# Patient Record
Sex: Female | Born: 1935
Health system: Midwestern US, Academic
[De-identification: ages and names within clinical notes are randomized; demographics above are authoritative.]

---

## 2019-05-17 ENCOUNTER — Encounter: Admit: 2019-05-17 | Discharge: 2019-05-17

## 2019-05-17 NOTE — Progress Notes
Pt presented with non specific complaints,  Headache, dizziness, confusion, gait instability.  CT head unremarkable.  CTA unremarkable.  MRI---> scattered diffusion, characterized by infarct.    Not a TPA candidate.  Dr. Consuella Lose recommends admit with basic workup.  ED provider is agreeable to POC.

## 2020-02-19 IMAGING — CT CT head/brain wo con
3 of 4 series · 14 of 47 positions shown, 16 images · non-contrast
Comparison: none

[Series 4: brain cor 5.00 hr40 s3 · coronal · 0.30mm/px · 3 of 39 slices shown]
[im 13/39  brain]
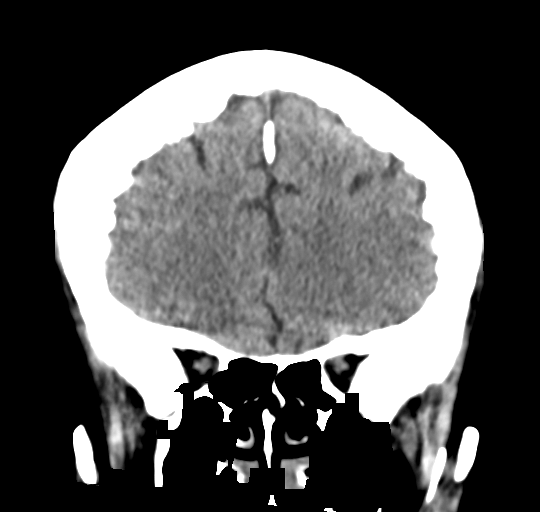
[im 17/39  brain]
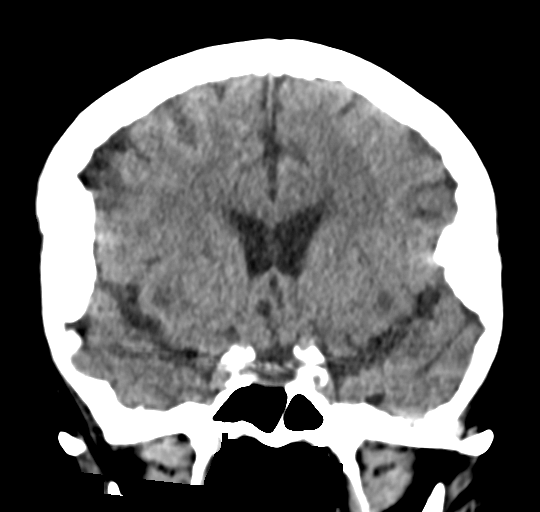
[im 22/39  brain]
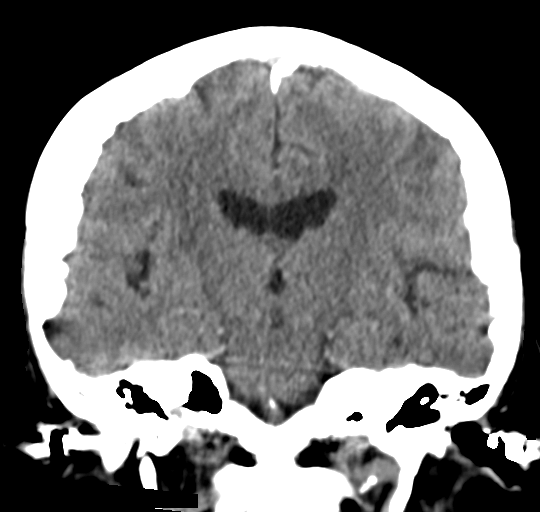

[Series 6: brain sag 5.00 hr40 s3 · sagittal · 0.30mm/px · 3 of 32 slices shown]
[im 11/32  brain]
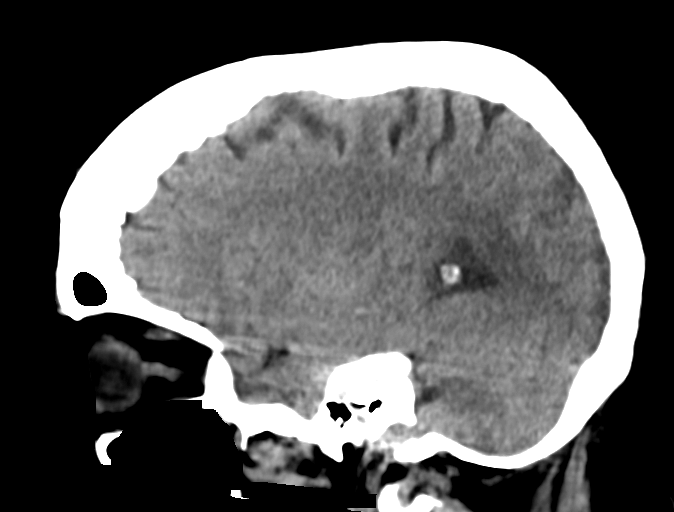
[im 16/32  brain]
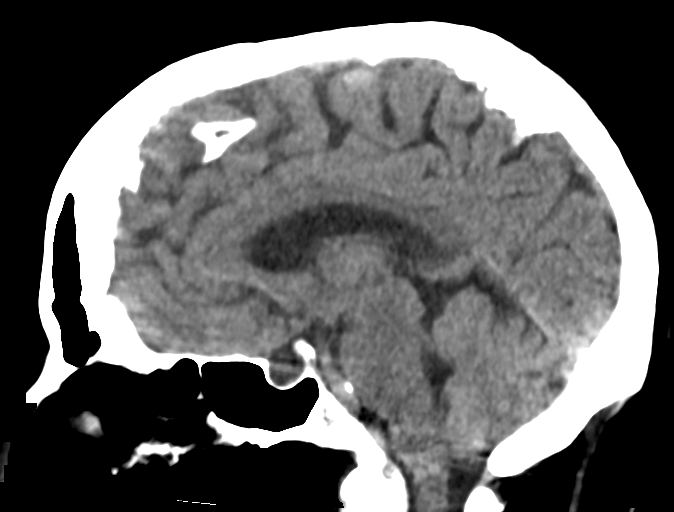
[im 21/32  brain]
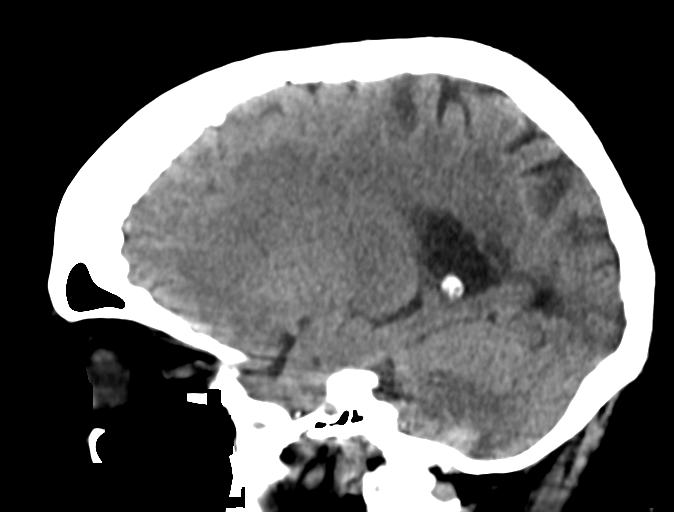

[Series 8: brain ax 2.00 hr60 s3 · axial · 0.33mm/px · z∈[-541,-421]mm · 8 of 77 slices shown, 10 images]
[im 8/77  brain]
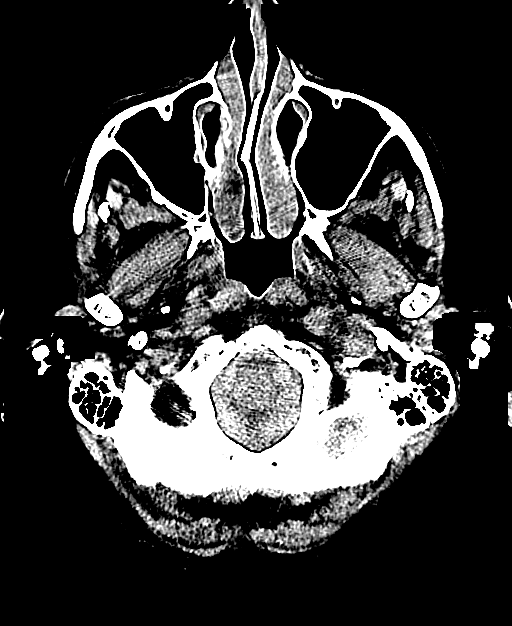
[im 8/77  bone]
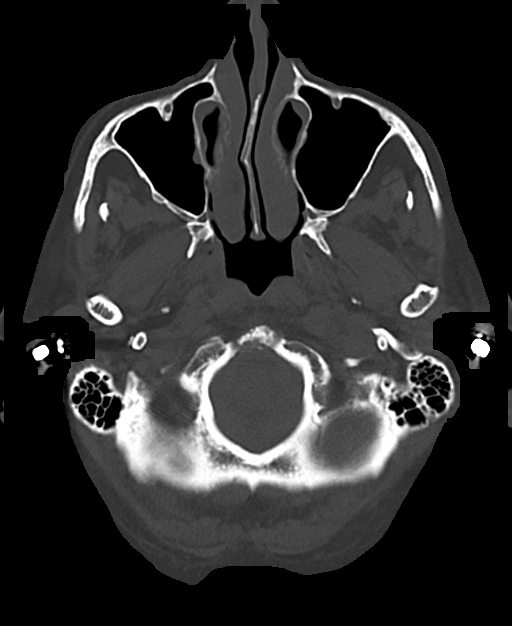
[im 15/77  brain]
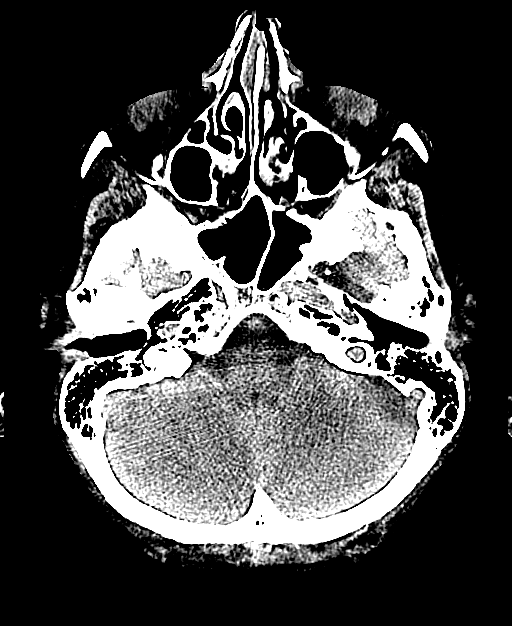
[im 26/77  brain]
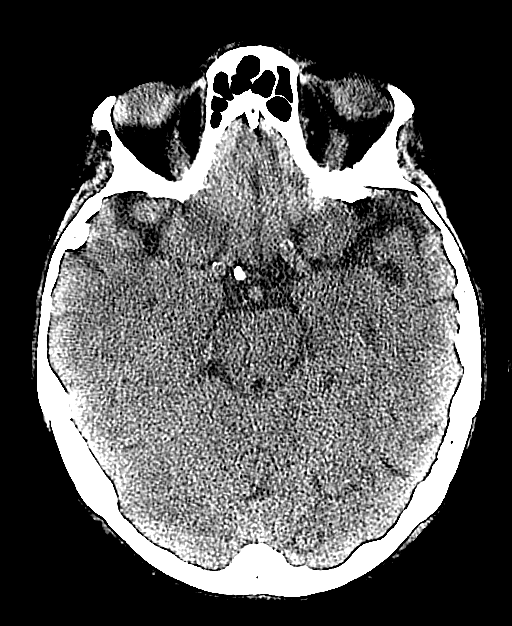
[im 33/77  brain]
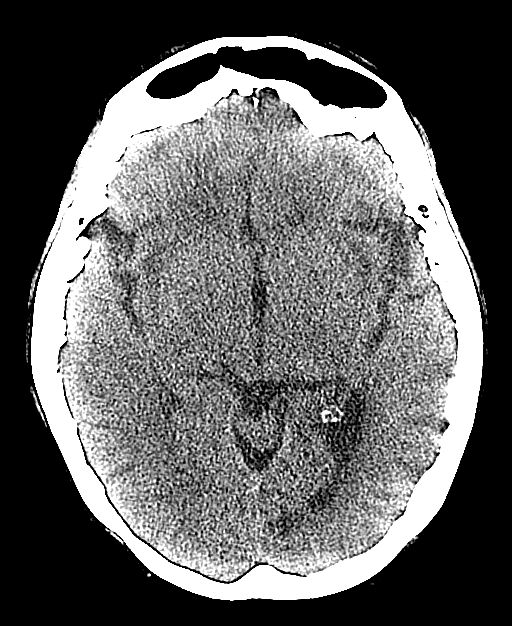
[im 44/77  brain]
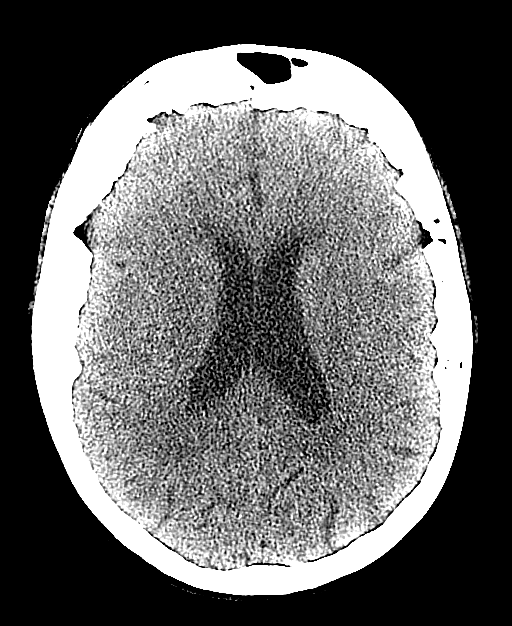
[im 44/77  bone]
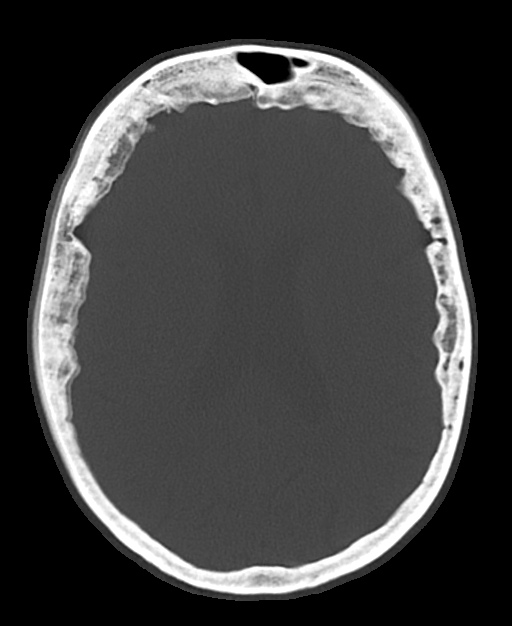
[im 51/77  brain]
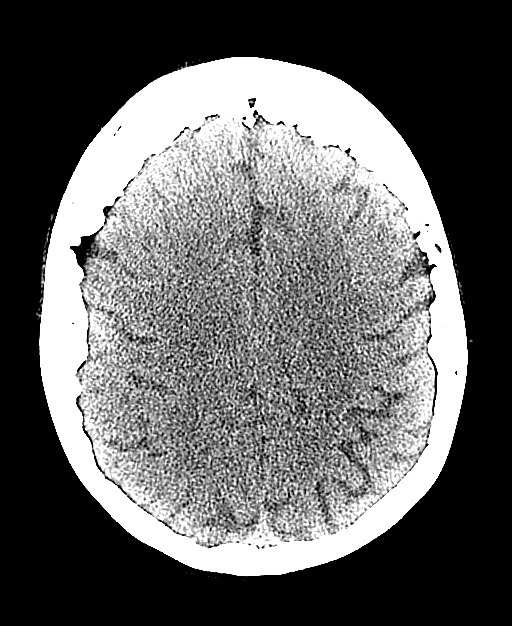
[im 62/77  brain]
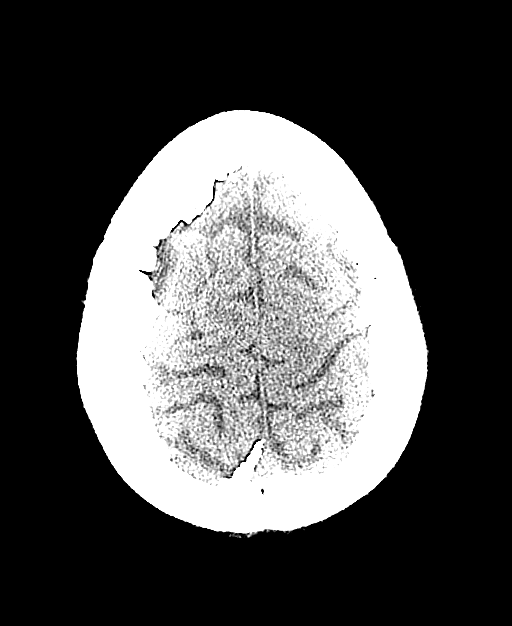
[im 69/77  brain]
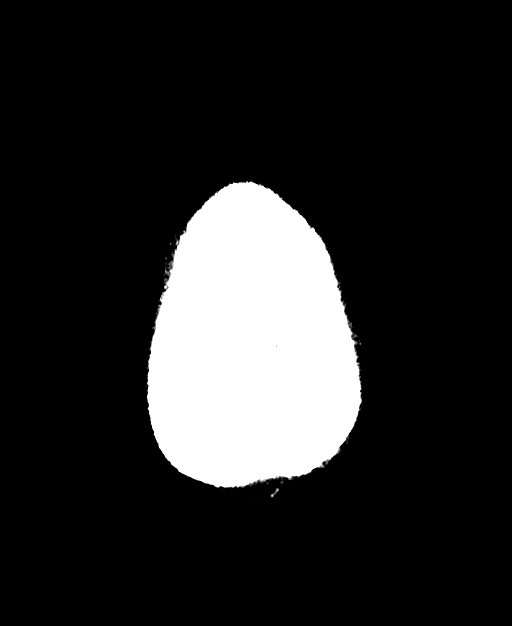

[14 of 47 positions shown; findings below may reference images not displayed]

DIAGNOSTIC STUDIES

EXAM

CT head without contrast.

INDICATION

AMS
0 ct/0 nm. Right sided weakness and loss of sight. H/O stroke in May 2019. me/rg

TECHNIQUE

All CT scans at this facility use dose modulation, iterative reconstruction, and/or weight based
dosing when appropriate to reduce radiation dose to as low as reasonably achievable.

Number of previous computed tomography exams in the last 12 months is 0  .

Number of previous nuclear medicine myocardial perfusion studies in the last 12 months is 0  .

COMPARISONS

None available

FINDINGS

Cortical atrophy and proportional enlargement of the ventricular system is seen. There is small
vessel ischemic disease throughout the supratentorial white matter. Subtle loss of gray-white dif
ferentiation and cortical sulci effacement is evident within the right occipital lobe concerning for
acute ischemia.

Small sub insular to chronic appearing infarcts are noted bilaterally.

No concerning osseous lesions are seen. There is calcification of the transverse ligament which may
indicate CPPD. There is calcification of the cavernous carotid arteries.

IMPRESSION

Acute to subacute appearing infarct involving the right occipital lobe.

Cortical atrophy and chronic small vessel ischemic disease including chronic appearing sub insular
infarcts bilaterally.

No intracranial hemorrhage or mass effect.

Bilateral hearing aids are in place with calcification of the cavernous carotid arteries. Report
called to Dr. Rtoyota  at [DATE] p.m..

Tech Notes:

0 ct/0 nm. Right sided weakness and loss of sight. H/O stroke in May 2019. me/rg

## 2020-03-15 IMAGING — CR [ID]
1 series · 1 of 1 positions shown · non-contrast
Comparison: none

[chest ap]
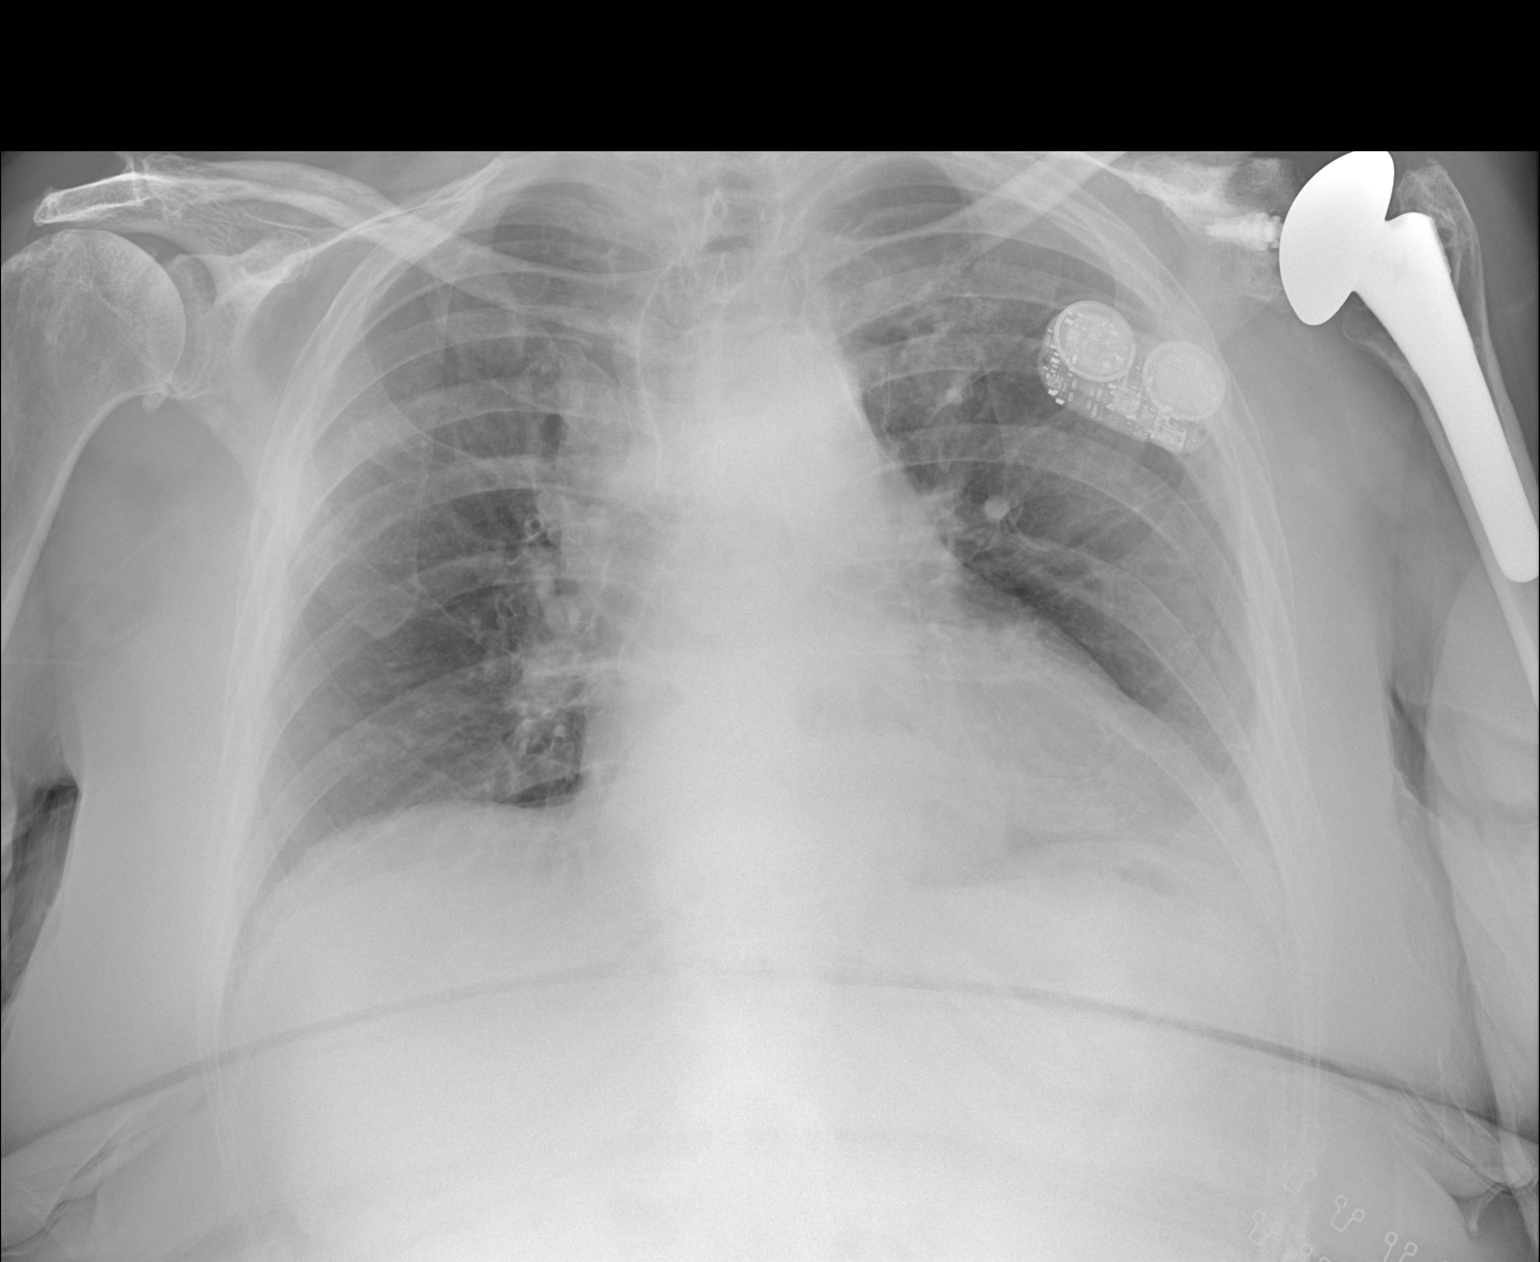

[1 of 1 positions shown; findings below may reference images not displayed]

DIAGNOSTIC STUDIES

EXAM

XR chest 1V

INDICATION

weakness
HEADACHE, WHICH SHE STATES IS GETTING WORSE. H/O PRIOR CVA X 1 MONTH AND 15 MONTHS AGO. H/O HTN.
HB

TECHNIQUE

AP chest.

COMPARISONS

February 19, 2020

FINDINGS

Cardiac silhouette is unchanged with mild cardiomegaly. Electrode overlies left chest with prior
left shoulder arthroplasty. No acute infiltrates are seen.

IMPRESSION

No acute infiltrates throughout the chest.

Tech Notes:

HEADACHE, WHICH SHE STATES IS GETTING WORSE. H/O PRIOR CVA X 1 MONTH AND 15 MONTHS AGO. H/O HTN.
HB

## 2020-03-15 IMAGING — CT BRAIN WO(Adult)
1 of 2 series · 1 of 2 positions shown, 2 images · non-contrast
Comparison: none

[Series 2: brain ax 5.00 hr40 s3 · axial · 0.31mm/px · z∈[-429,-429]mm · 1 of 1 slices shown, 2 images]
[im 1/1  brain]
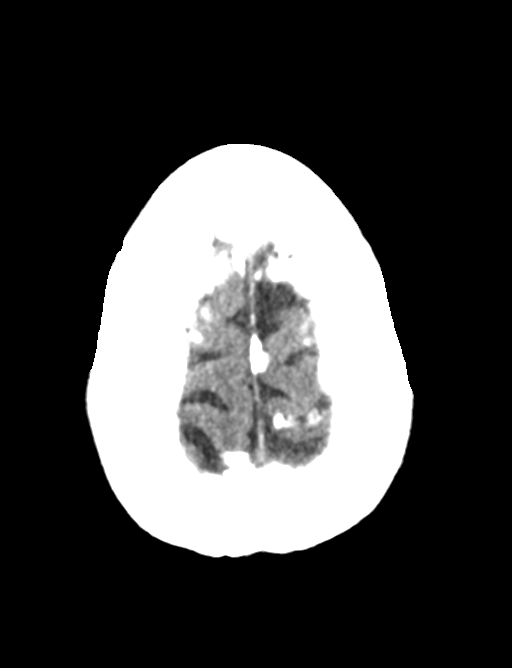
[im 1/1  bone]
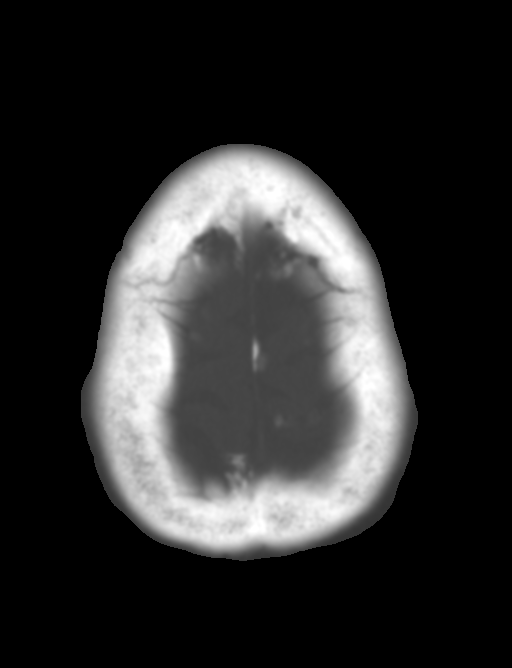

[1 of 2 positions shown; findings below may reference images not displayed]

DIAGNOSTIC STUDIES

EXAM

CT head without contrast

INDICATION

recent CVA, headache
HEADACHE, WHICH SHE STATES IS GETTING WORSE. H/O PRIOR CVA X 1 MONTH AND 15 MONTHS AGO. H/O HTN.
HB

TECHNIQUE

All CT scans at this facility use dose modulation, iterative reconstruction, and/or weight based
dosing when appropriate to reduce radiation dose to as low as reasonably achievable.

Number of previous computed tomography exams in the last 12 months is 1.

Number of previous nuclear medicine myocardial perfusion studies in the last 12 months is 0  .

COMPARISONS

February 19, 2020

FINDINGS

There is now more extensive diminished attenuation in the right posterior parietal lobe image 16
series 2. This may reflect further demarcation of previously noted infarct versus extension of prior
infarct. Old left occipital lobe infarct, sub insular infarcts, and underlying small vessel ischemic
disease are unchanged. There is no intracranial hemorrhage or mass effect. Bony calvarium is
unchanged.

IMPRESSION

Further diminished attenuation now predominately involving the posterior right parietal lobe.
Whether this represents extension of prior infarct versus better visualization of prior infarct is
indeterminate. There is no intracranial hemorrhage or mass effect.

Additional underlying small vessel ischemic disease and small old left occipital infarct and sub
insular infarcts which are unchanged. Report called to Dr. Giorgi  at [DATE] p.m..

Tech Notes:

HEADACHE, WHICH SHE STATES IS GETTING WORSE. H/O PRIOR CVA X 1 MONTH AND 15 MONTHS AGO. H/O HTN.
HB

## 2020-03-16 IMAGING — MR Head^Brain
1 series · 48 of 48 positions shown · non-contrast
Comparison: none

[Series 4: cow · axial · 0.8mm · 0.56mm/px · z∈[-99,-29]mm · 48 of 90 slices shown]
[im 1/90]
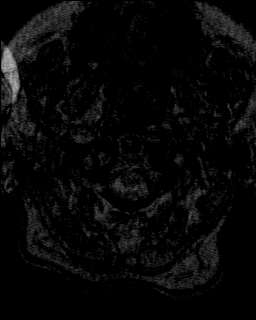
[im 2/90]
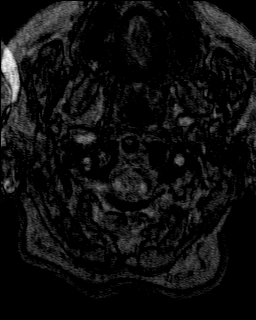
[im 4/90]
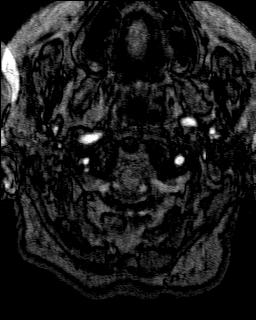
[im 6/90]
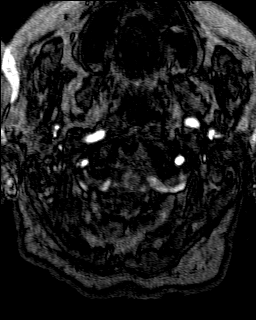
[im 8/90]
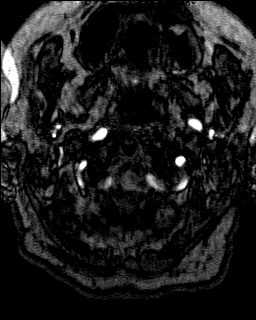
[im 10/90]
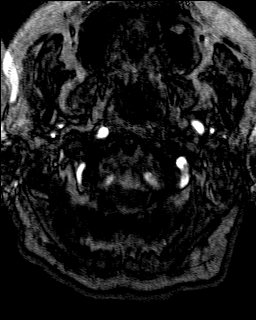
[im 12/90]
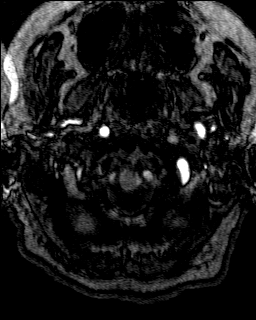
[im 14/90]
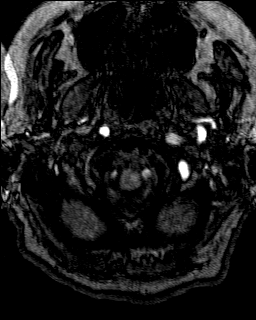
[im 16/90]
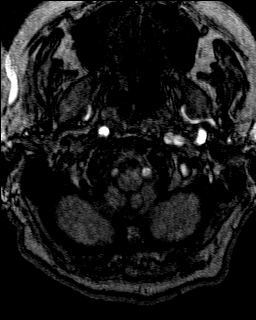
[im 18/90]
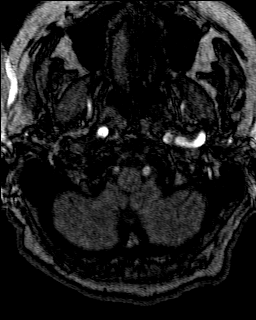
[im 19/90]
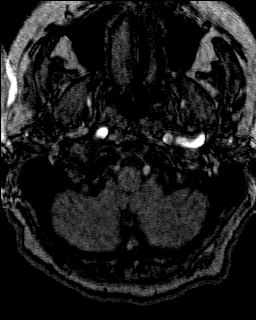
[im 21/90]
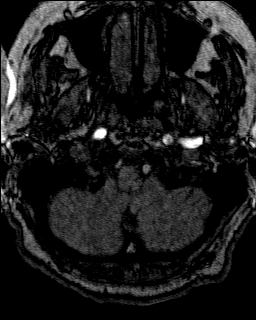
[im 23/90]
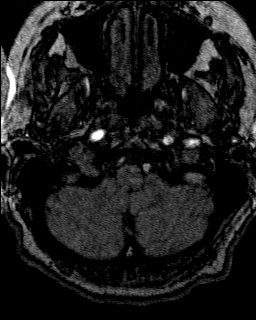
[im 25/90]
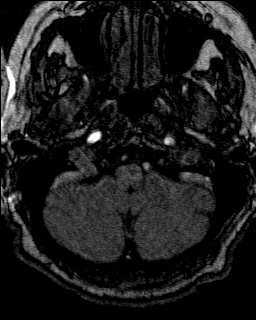
[im 27/90]
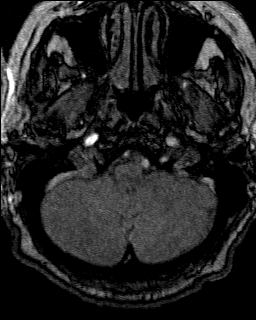
[im 29/90]
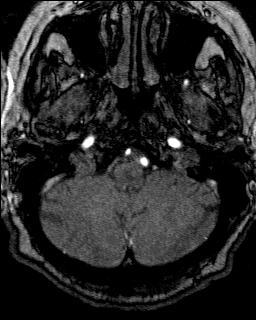
[im 31/90]
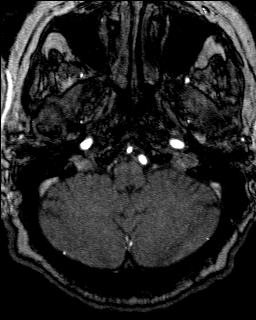
[im 33/90]
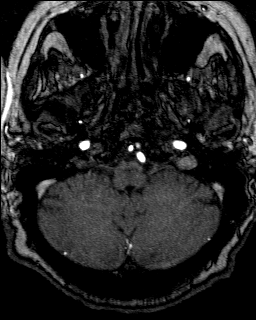
[im 35/90]
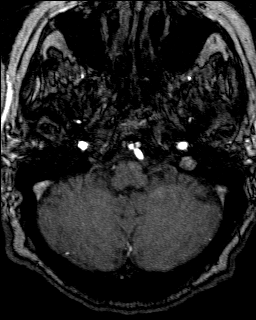
[im 36/90]
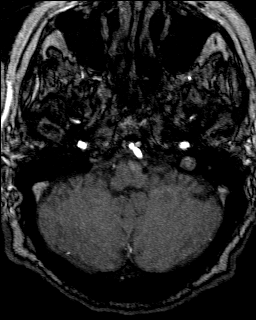
[im 38/90]
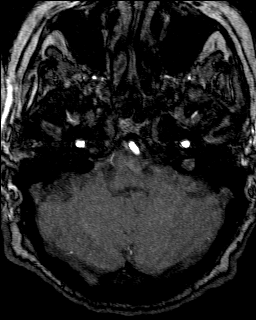
[im 40/90]
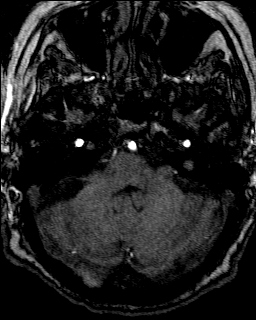
[im 42/90]
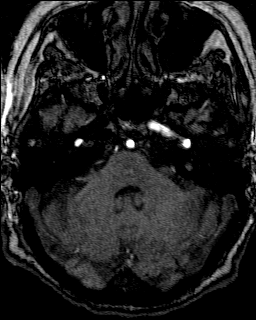
[im 44/90]
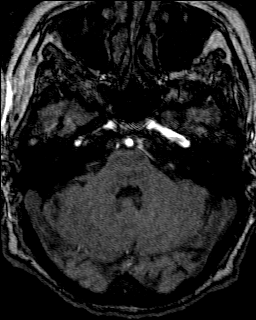
[im 46/90]
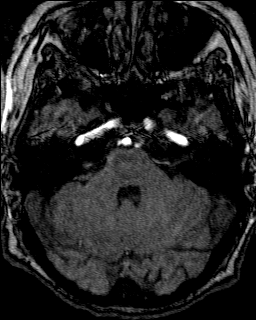
[im 48/90]
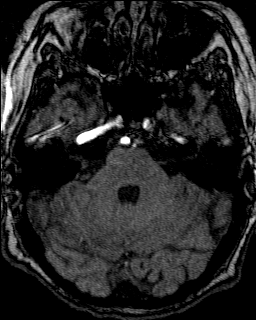
[im 50/90]
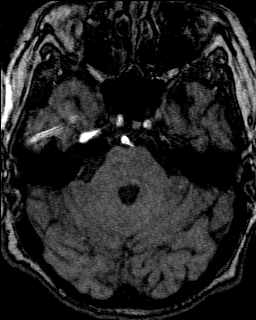
[im 52/90]
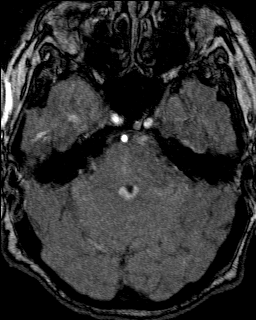
[im 54/90]
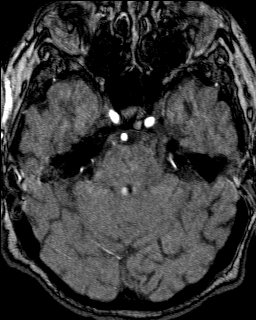
[im 55/90]
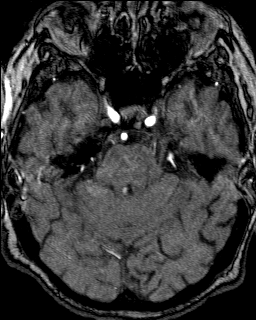
[im 57/90]
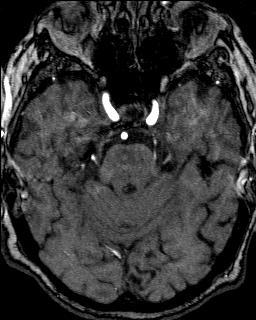
[im 59/90]
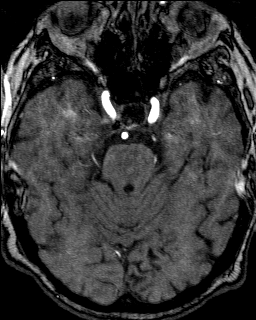
[im 61/90]
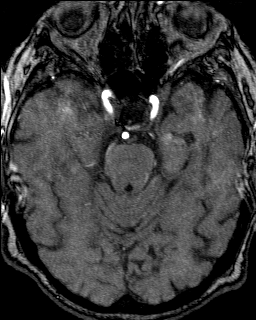
[im 63/90]
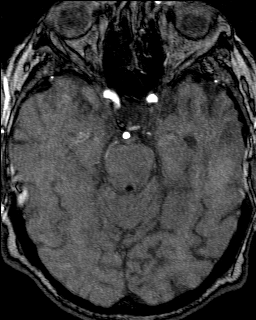
[im 65/90]
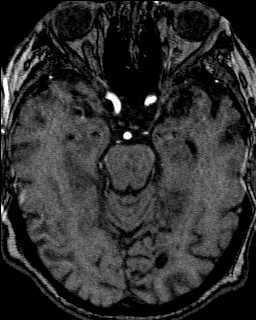
[im 67/90]
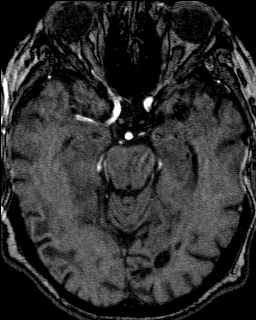
[im 69/90]
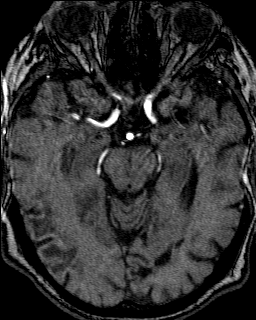
[im 71/90]
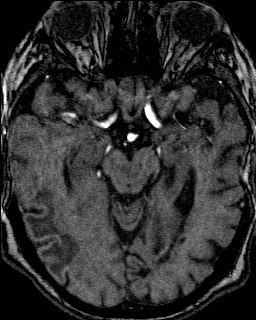
[im 72/90]
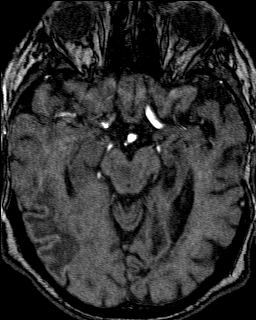
[im 74/90]
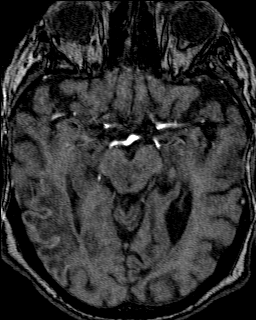
[im 76/90]
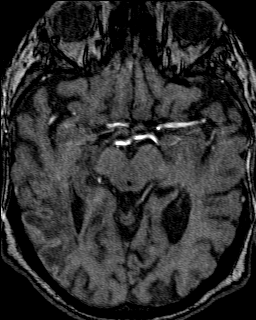
[im 78/90]
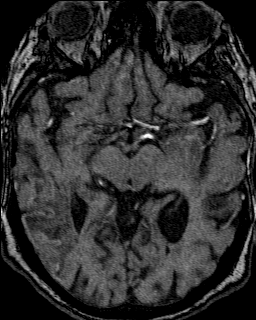
[im 80/90]
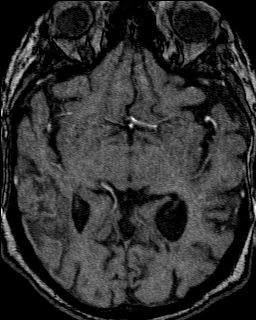
[im 82/90]
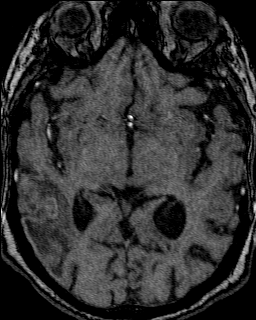
[im 84/90]
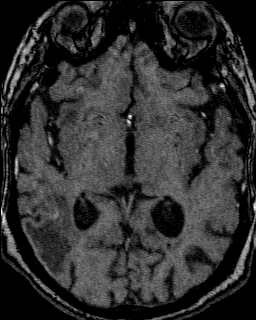
[im 86/90]
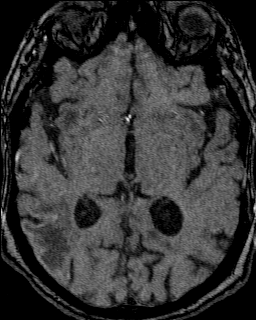
[im 88/90]
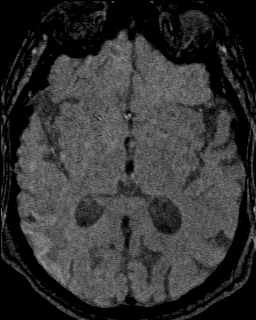
[im 90/90]
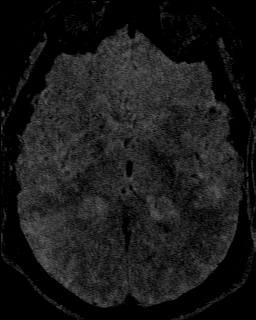

[48 of 48 positions shown; findings below may reference images not displayed]

DIAGNOSTIC STUDIES

EXAM

MR angiography of the circle Willis.

INDICATION

acute stroke
CVA SYMPTOMS STARTING YESTERDAY WITH IMLPROVEMENT, HX CVA LAST MONTH [DATE].  RG

TECHNIQUE

3D time-of-flight MR angiography was obtained.

COMPARISONS

None available

FINDINGS

There is normal flow throughout the vertebral basilar system with a dominant left vertebral artery.
Normal flow is seen throughout the cavernous carotid arteries. The communicating arteries are not
seen no aneurysm is evident. No filling defects are seen throughout the visualized vasculature.

IMPRESSION

Dominant left vertebral artery with normal flow throughout the vertebral basilar system and
cavernous carotid arteries. The communicating arteries are not visualized and likely are aplastic or
hypoplastic. No aneurysms are evident. The visualized aspects of the middle, anterior, and posterior
cerebral arteries reveal no filling defects.

Tech Notes:

CVA SYMPTOMS STARTING YESTERDAY WITH IMLPROVEMENT, HX CVA LAST MONTH [DATE].  RG

## 2020-03-16 IMAGING — MR Head^Brain
8 of 9 series · 43 of 48 positions shown · non-contrast
Comparison: none

[Series 2: T1 · sagittal · 5.0mm · 0.45mm/px · 3 of 20 slices shown (1 of 2)]
[im 1/20]
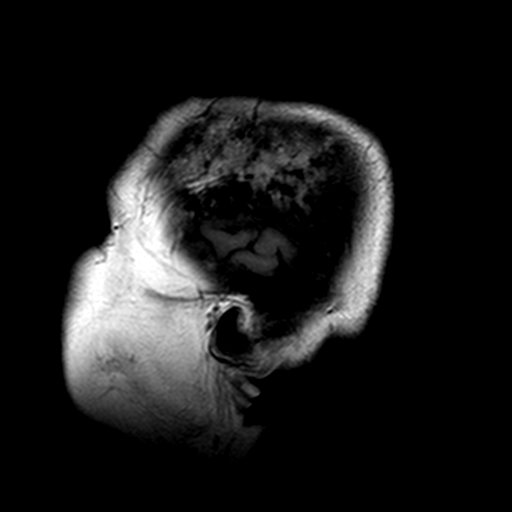
[im 10/20]
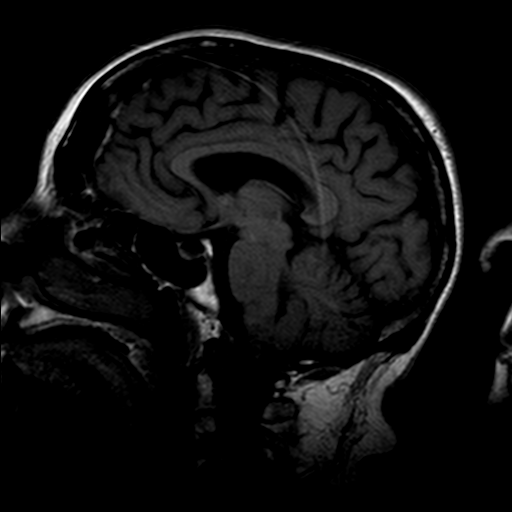
[im 20/20]
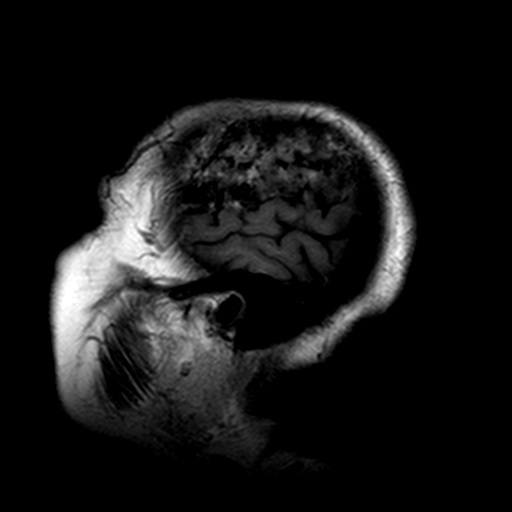

[Series 5: DWI · axial · 5.0mm · 1.80mm/px · z∈[-82,+44]mm · 11 of 63 slices shown (1 of 2)]
[im 1/63]
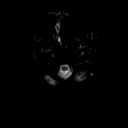
[im 7/63]
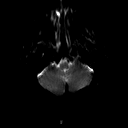
[im 13/63]
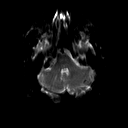
[im 19/63]
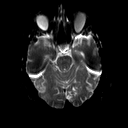
[im 25/63]
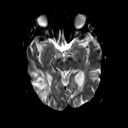
[im 32/63]
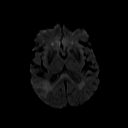
[im 38/63]
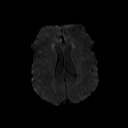
[im 44/63]
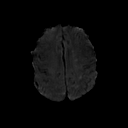
[im 50/63]
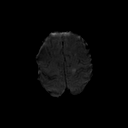
[im 56/63]
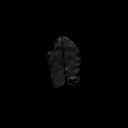
[im 63/63]
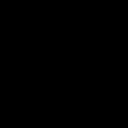

[Series 6: DWI · axial · 5.0mm · 1.80mm/px · z∈[-82,+44]mm · 4 of 21 slices shown (2 of 2)]
[im 1/21]
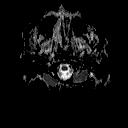
[im 7/21]
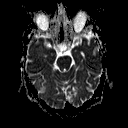
[im 14/21]
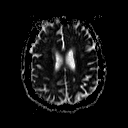
[im 21/21]
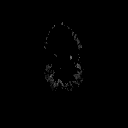

[Series 7: FLAIR · axial · 5.0mm · 0.45mm/px · z∈[-87,+55]mm · 5 of 24 slices shown]
[im 1/24]
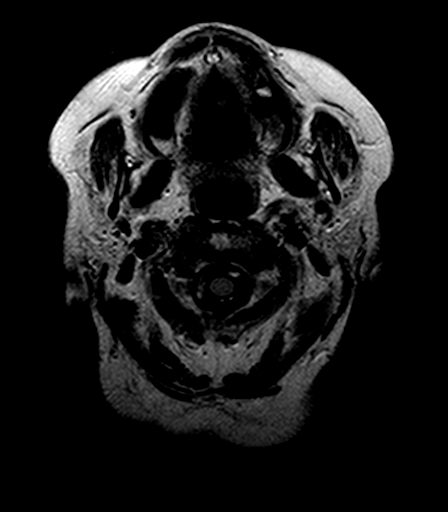
[im 6/24]
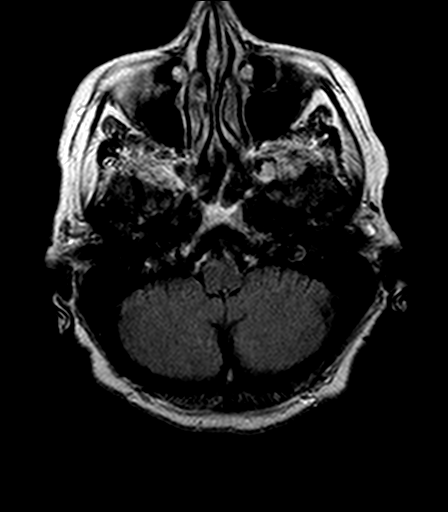
[im 12/24]
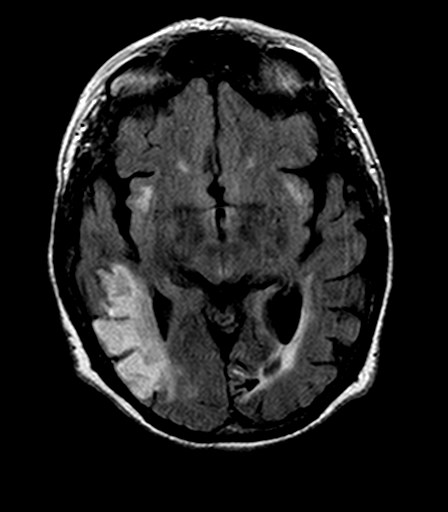
[im 18/24]
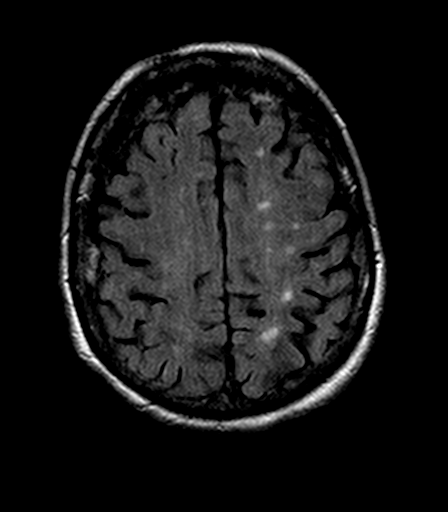
[im 24/24]
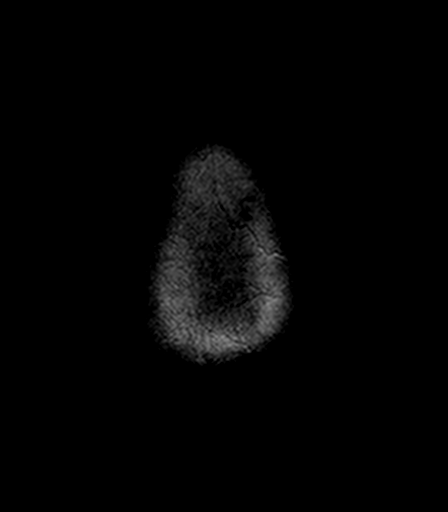

[Series 8: T2 · axial · 5.0mm · 0.72mm/px · z∈[-87,+54]mm · 5 of 24 slices shown (1 of 3)]
[im 1/24]
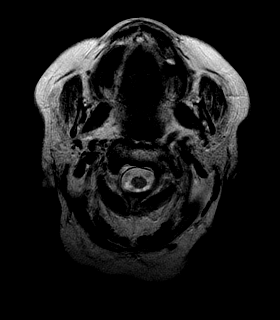
[im 6/24]
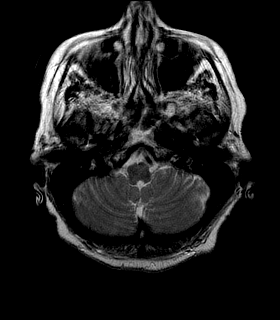
[im 12/24]
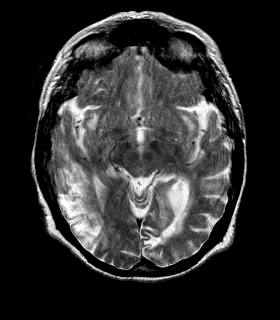
[im 18/24]
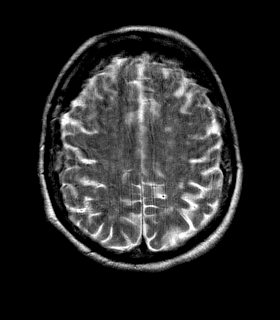
[im 24/24]
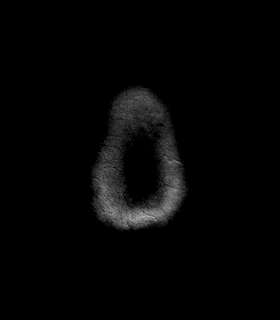

[Series 9: T1 · axial · 5.0mm · 0.45mm/px · z∈[-88,+54]mm · 5 of 24 slices shown (2 of 2)]
[im 1/24]
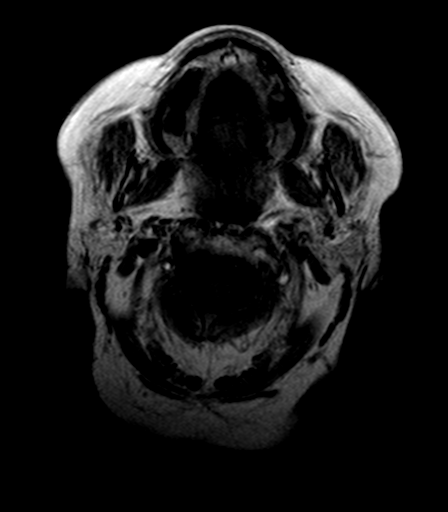
[im 6/24]
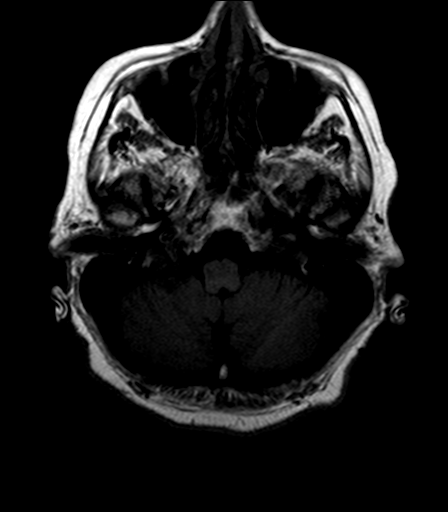
[im 12/24]
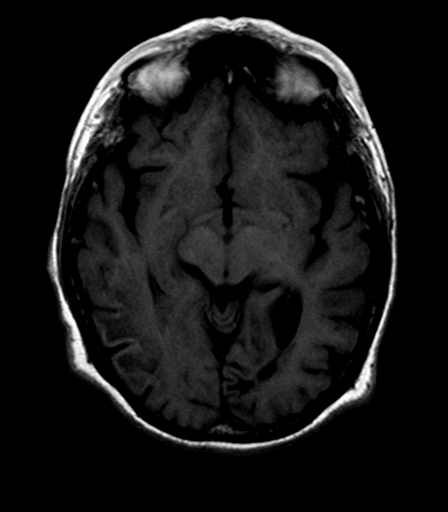
[im 18/24]
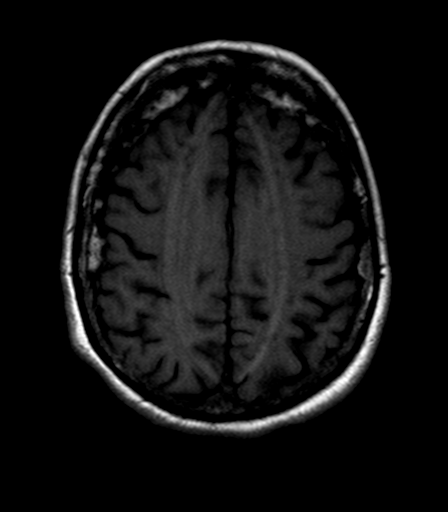
[im 24/24]
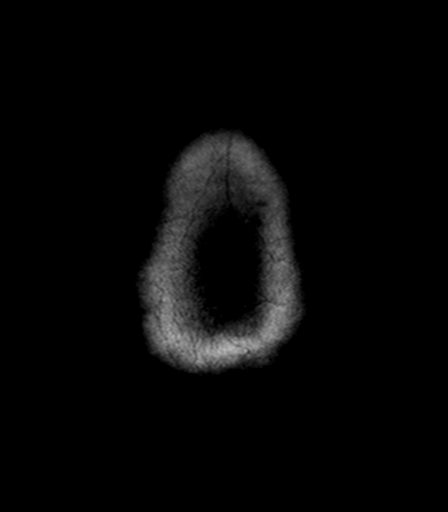

[Series 11: T2 · coronal · 5.0mm · 0.69mm/px · 5 of 26 slices shown (2 of 3)]
[im 1/26]
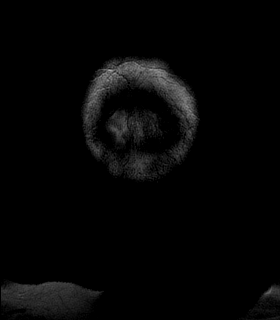
[im 7/26]
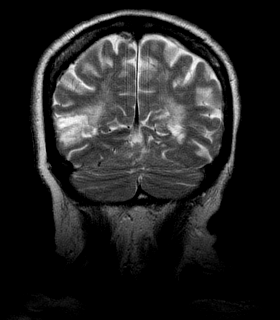
[im 13/26]
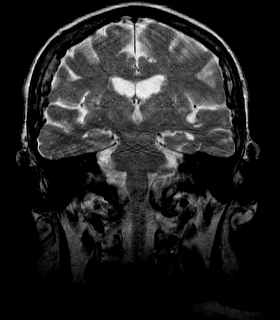
[im 19/26]
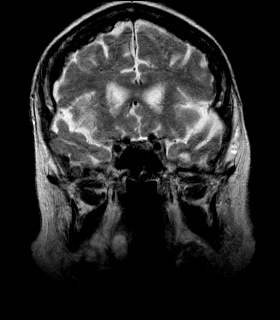
[im 26/26]
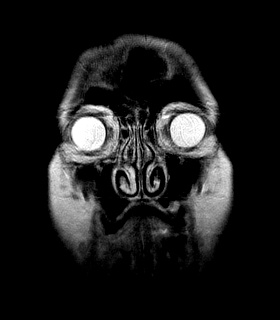

[Series 15: T2 · axial · 5.0mm · 0.72mm/px · z∈[-87,+54]mm · 5 of 24 slices shown (3 of 3)]
[im 1/24]
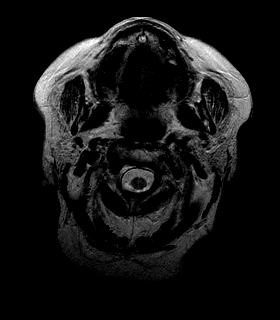
[im 6/24]
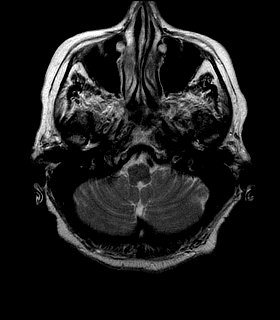
[im 12/24]
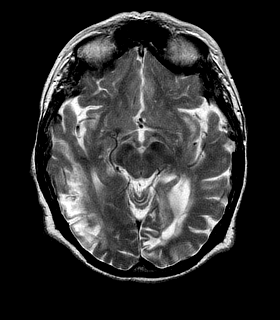
[im 18/24]
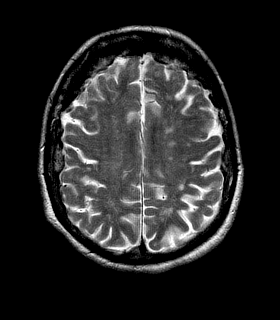
[im 24/24]
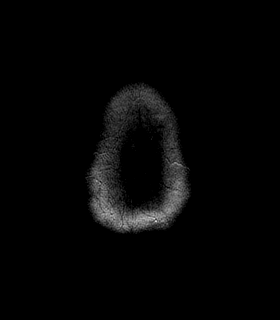

[43 of 48 positions shown; findings below may reference images not displayed]

DIAGNOSTIC STUDIES

EXAM

MRI of the brain without contrast.

INDICATION

stroke
PRESENTED YESTERDAY WITH STROKE LIKE SYMPTOMS.  IMPROVED TODAY.  PT DID HAVE CVA LAST MONTH [DATE],

TECHNIQUE

Sagittal axial and coronal images were obtained with variable T1 and T2 weighting.

COMPARISONS

None available

FINDINGS

Large wedge-shaped area of T2 signal can be seen involving the posterior right parietal lobe image
12-14 series 7. This measures 6 cm AP. There is no diffusion restriction which would indicate that
this is subacute and likely related to prior event in Monday February, 2020. Multiple foci of T2 signal
are noted throughout the supratentorial white matter consistent with small vessel ischemic change.
Old left occipital infarct is noted.

No intracranial mass or hemorrhage is seen. There is cortical atrophy and proportional enlargement
of the ventricular system.

The expected signal voids are noted throughout the major intracranial vascular structures, mastoid
air cells, and paranasal sinuses. Minimal small vessel ischemic changes are noted within the pons.
Pituitary sella is empty.

IMPRESSION

Large subacute infarct involving the right posterior parietal lobe. No intracranial hemorrhage or
mass effect is seen.

Extensive small vessel ischemic disease throughout the supratentorial white matter with old left
occipital infarct.

Tech Notes:

PRESENTED YESTERDAY WITH STROKE LIKE SYMPTOMS.  IMPROVED TODAY.  PT DID HAVE CVA LAST MONTH [DATE],

## 2020-05-21 ENCOUNTER — Ambulatory Visit: Admit: 2020-05-21 | Discharge: 2020-05-21

## 2020-05-21 ENCOUNTER — Encounter: Admit: 2020-05-21 | Discharge: 2020-05-21

## 2020-05-21 DIAGNOSIS — I10 Essential (primary) hypertension: Secondary | ICD-10-CM

## 2020-05-21 IMAGING — CR XR acute abdomen series
2 series · 2 of 2 positions shown · non-contrast
Comparison: none

[abdomen ap upright]
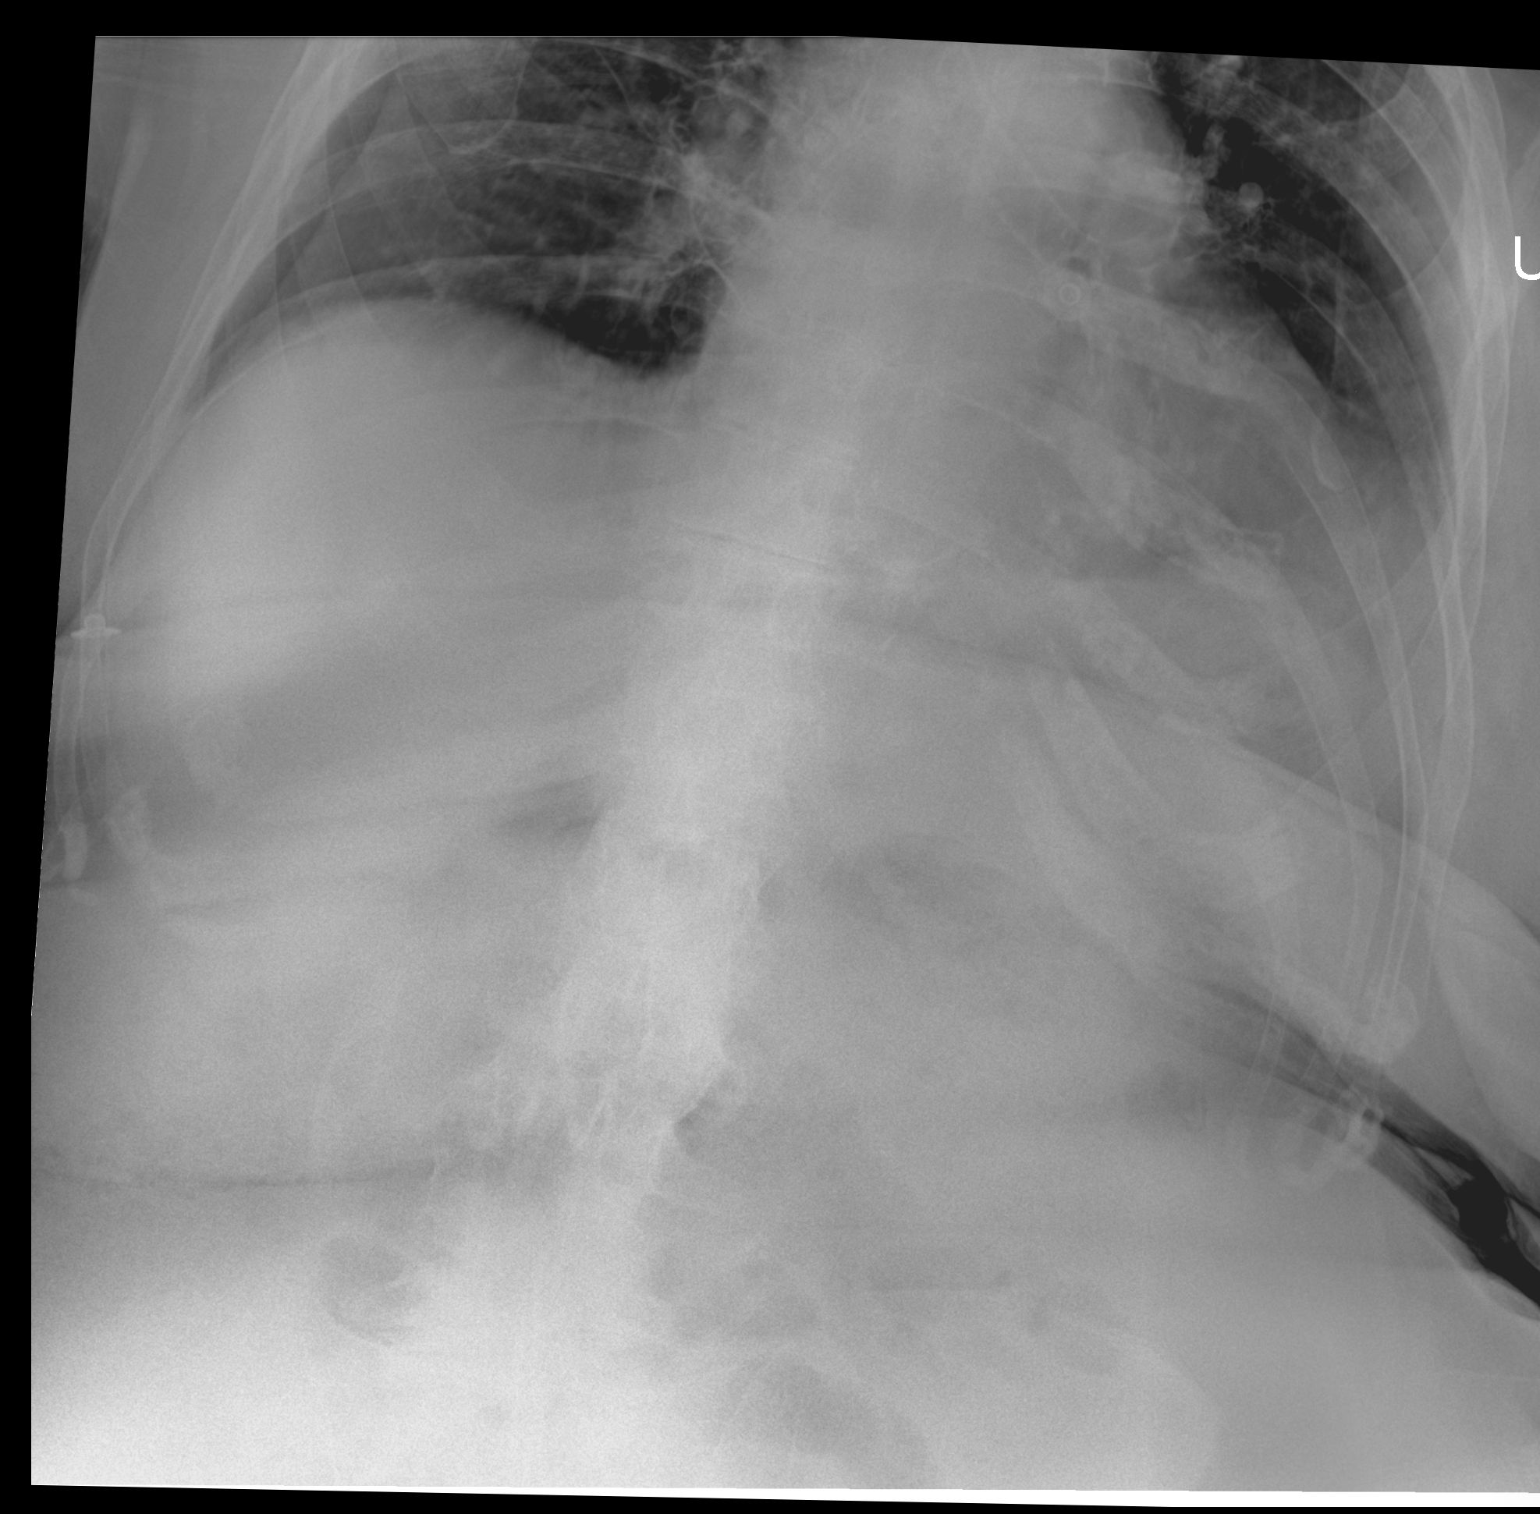

[abdomen kub supine]
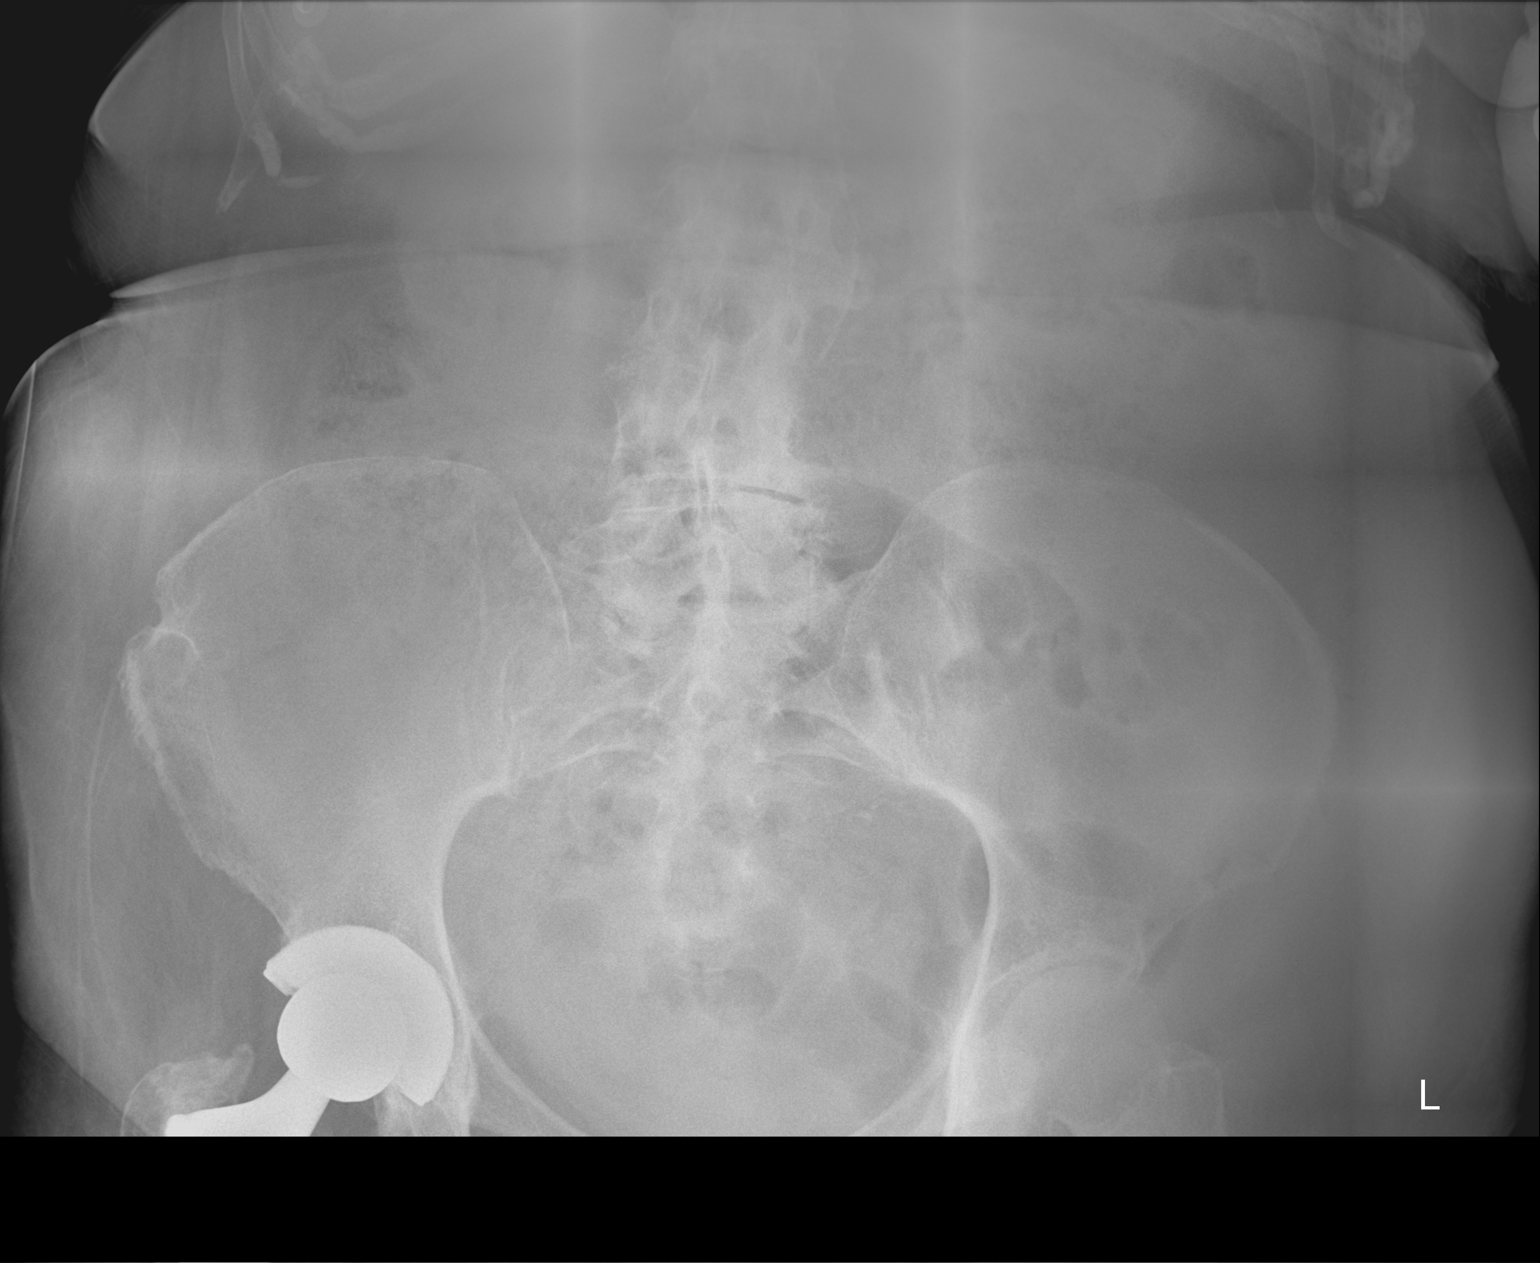

[2 of 2 positions shown; findings below may reference images not displayed]

------------- REPORT [id number] -------------
EXAM

XR acute abdomen series

INDICATION

distended
Cough and abdominal distention. ME/TB

TECHNIQUE

Supine view of the abdomen

COMPARISONS

None available at the time of dictation.

FINDINGS

Nonspecific, nonobstructive bowel gas pattern. Mild stool burden. Please note the upper abdomen is
only visualized. The lower pelvis is not visualized at the level of the true pelvis. Limited
evaluation for pneumoperitoneum with only supine views.

IMPRESSION
1. Mild stool burden.
2. No obstructive bowel gas pattern allowing for limitation of only visualization of the upper
abdomen.

Tech Notes:

Cough and abdominal distention. ME/TB

------------- REPORT [id number] -------------
**ADDENDUM**
ADDENDUM

Images were not available at the time of initial dictation.

After review of final images, there is mild stool burden throughout the colon. No radiographic
evidence of pneumoperitoneum.

Nonspecific, nonobstructive bowel gas pattern. Right total hip arthroplasty. Left shoulder
arthroplasty. Mild degenerative change of the left greater than right acromioclavicular joints. Mild
degenerative change of the right acromioclavicular joint.

Low lung volumes.

TD/TT: /

EXAM

XR acute abdomen series

INDICATION

distended
Cough and abdominal distention. ME/TB

TECHNIQUE

Supine view of the abdomen

COMPARISONS

None available at the time of dictation.

FINDINGS

Nonspecific, nonobstructive bowel gas pattern. Mild stool burden. Please note the upper abdomen is
only visualized. The lower pelvis is not visualized at the level of the true pelvis. Limited
evaluation for pneumoperitoneum with only supine views.

IMPRESSION
1. Mild stool burden.
2. No obstructive bowel gas pattern allowing for limitation of only visualization of the upper
abdomen.

Tech Notes:

Cough and abdominal distention. ME/TB

## 2020-05-21 IMAGING — US CARDUPBI
1 series · 13 of 16 positions shown · non-contrast
Comparison: none

[Series 1: us carotid duplex bi · 13 of 44 slices shown]
[im 1/44]
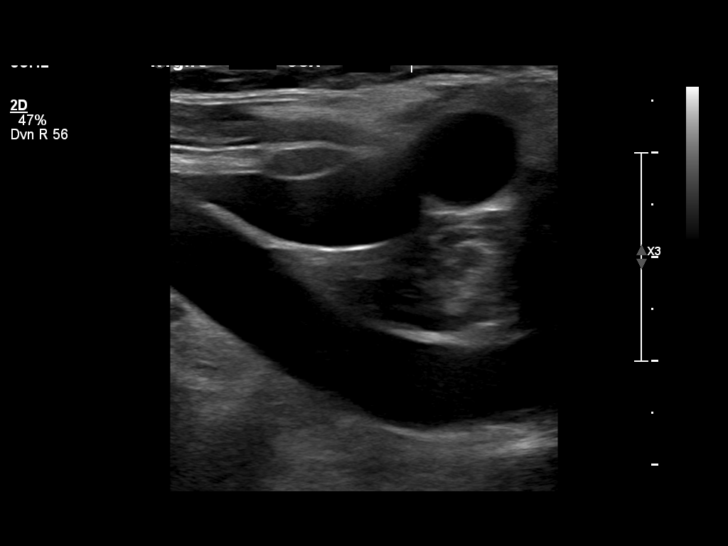
[im 3/44]
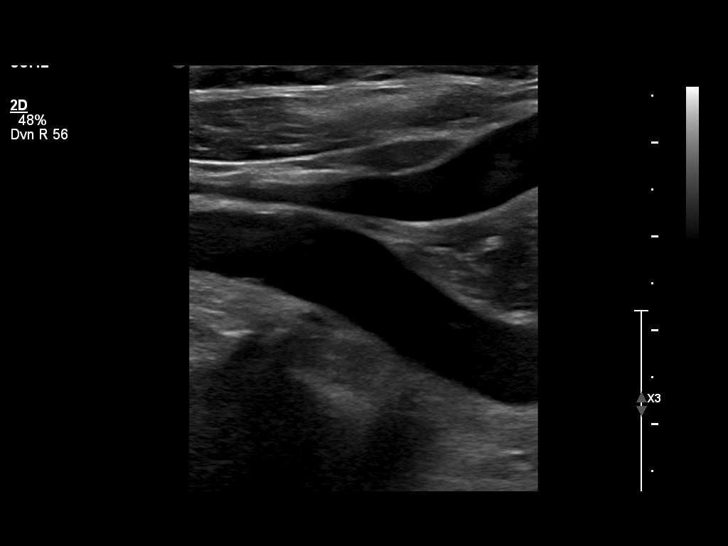
[im 9/44]
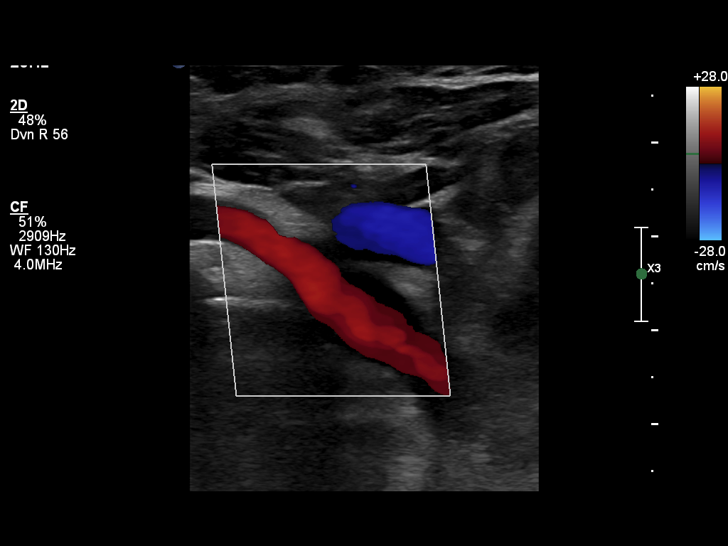
[im 12/44]
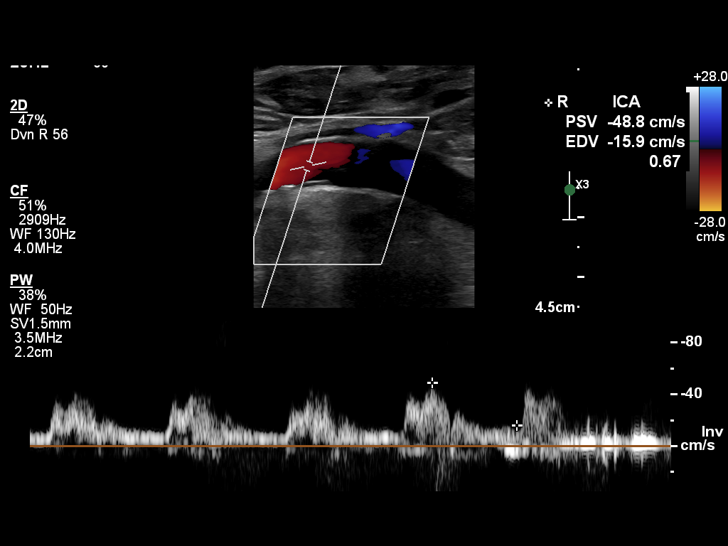
[im 15/44]
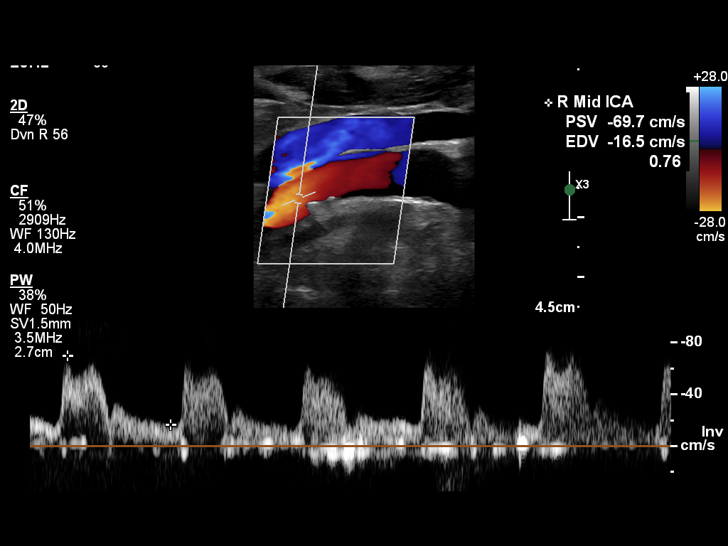
[im 18/44]
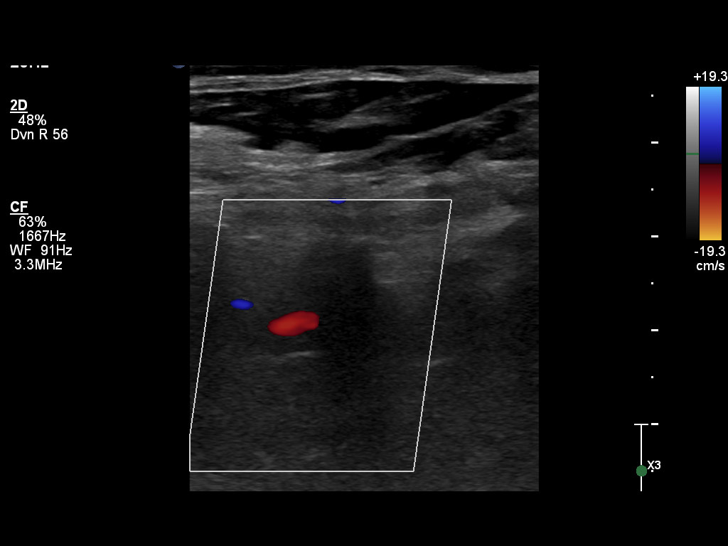
[im 23/44]
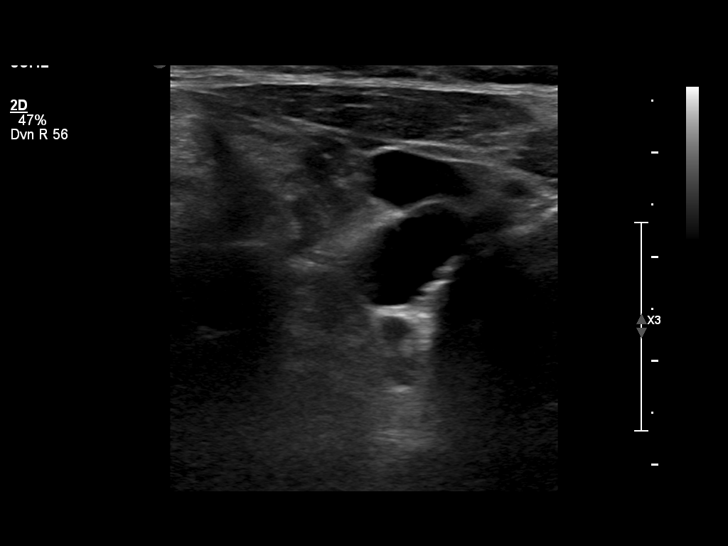
[im 26/44]
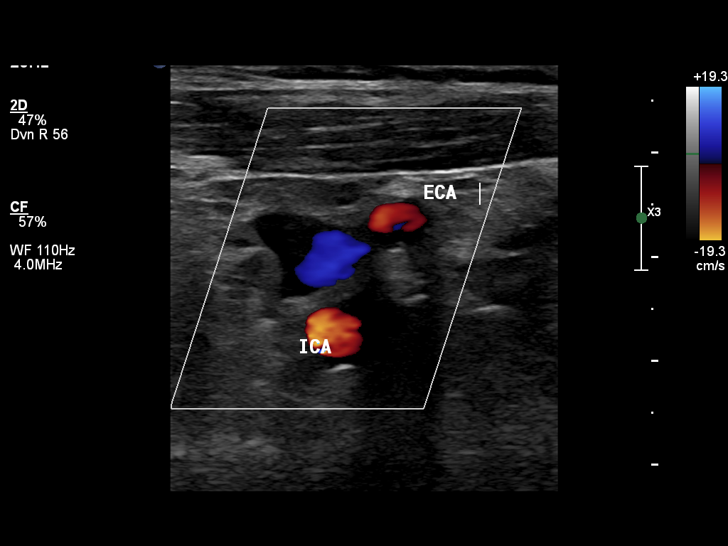
[im 29/44]
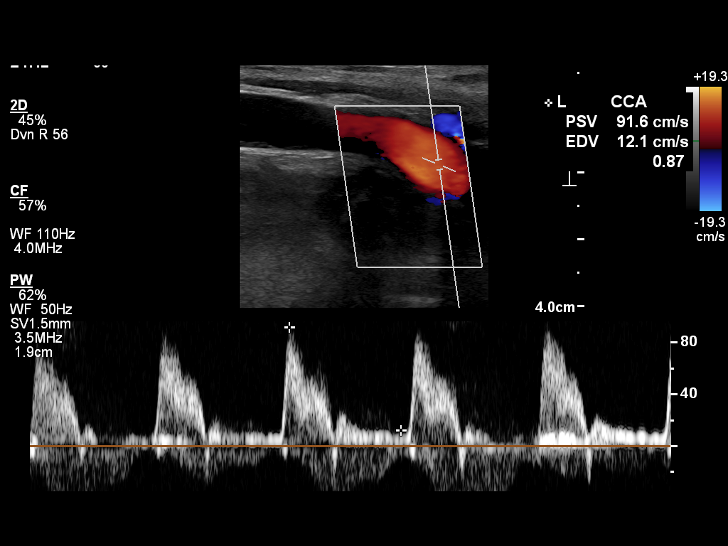
[im 32/44]
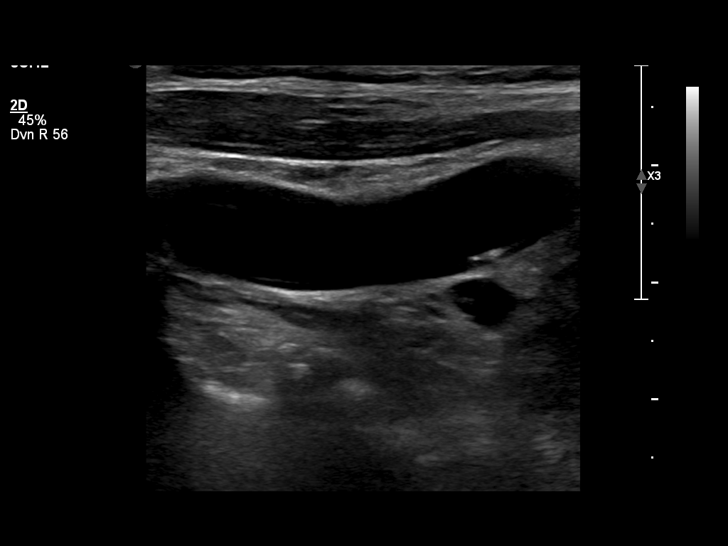
[im 35/44]
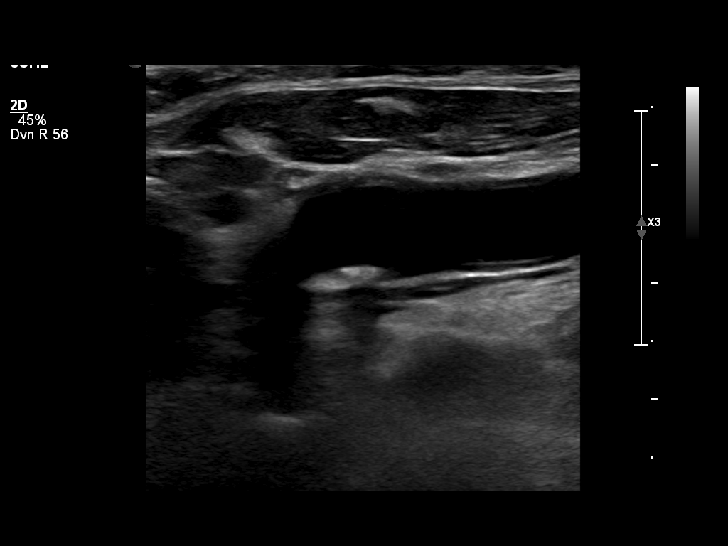
[im 41/44]
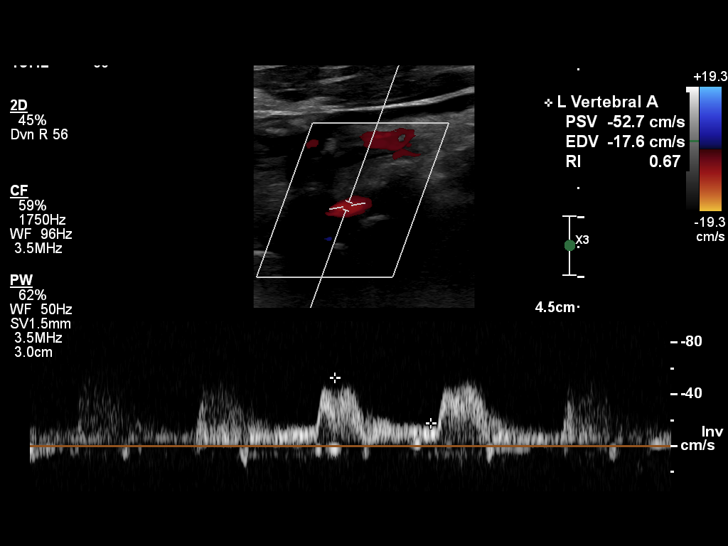
[im 44/44]
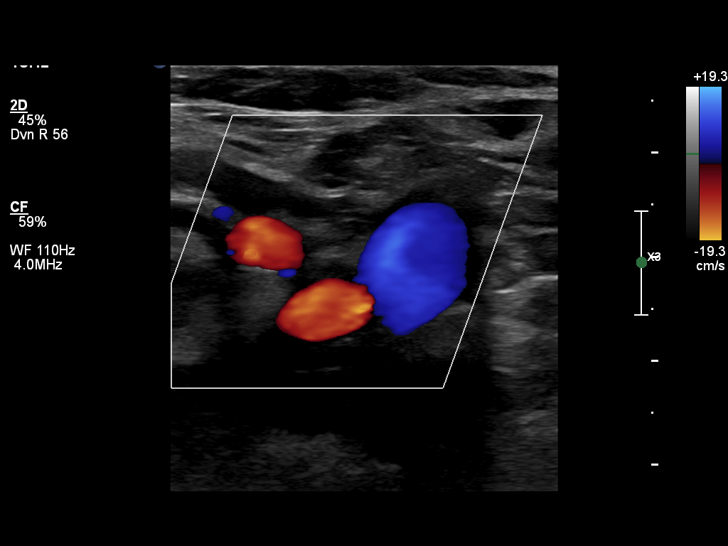

[13 of 16 positions shown; findings below may reference images not displayed]

DIAGNOSTIC STUDIES

EXAM

Carotid ultrasound

INDICATION

acute stroke
acute stroke; altered mental status

TECHNIQUE

Gray scale, color flow and pulsed doppler sonography of the carotid artery system is performed.

COMPARISONS

None

FINDINGS

RIGHT CAROTID:
Minimal atherosclerotic plaque is present in the distal common and proximal internal carotid
arteries.

Peak systolic velocity of the right ICA is 85.1 cm/sec; end diasolic velocity is 18.7cm/sec. IC/CC
ratio is 1.52. This corresponds to a 0-49% estimated diameter reduction.

LEFT CAROTID:
Minimal atherosclerotic plaque is present in the distal common and proximal internal carotid
arteries.

Peak systolic velocity of the left ICA is 76.8cm/se; end diastolic velocity is 15.4 cm/sec. IC/CC
ratio is 0.89. This corresponds to a 0-49% estimated diameter reduction.

VERTEBRAL ARTERIES:

Bilateral antegrade vertebral artery flow is identified.

IMPRESSION
1. No hemodynamically significant stenoses of the visualized extracranial carotid artery system.
Findings are compatible with less than 50 percent estimated diameter reductions of the ICAs
bilaterally.
2. Bilateral antegrade vertebral artery flow.

Study is performed in accordance with NASCET guidelines.

Tech Notes:

acute stroke; altered mental status

## 2020-05-21 IMAGING — US ECHOCOMCON
1 series · 16 of 24 positions shown · non-contrast
Comparison: none

[Series 1: us echo 2d, comp w/ contrast · 102 acquisitions, 16 frames shown]
[im 1/102]
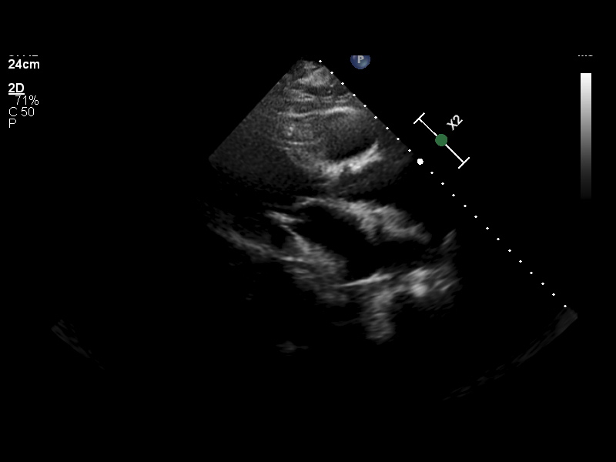
[im 9/102]
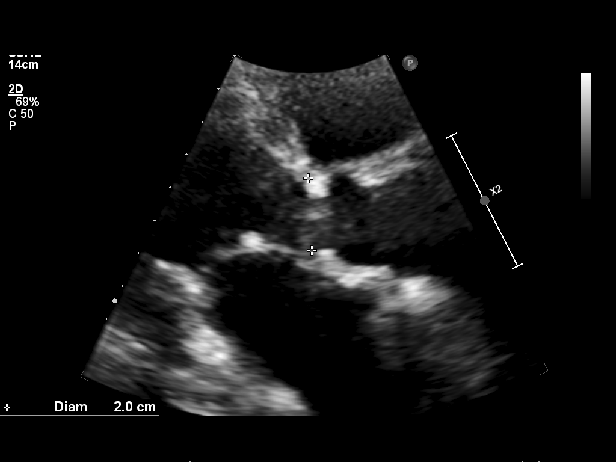
[im 14/102]
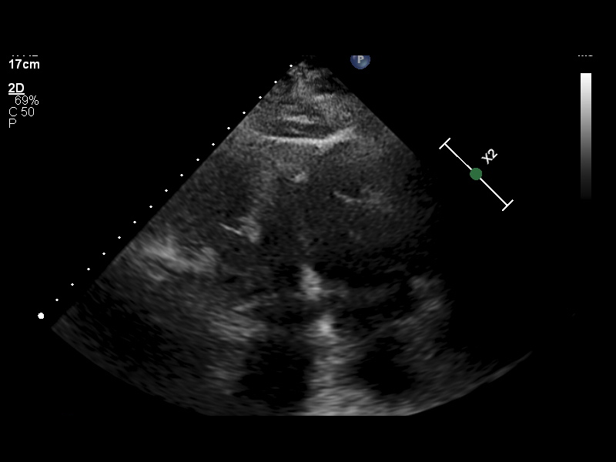
[im 22/102]
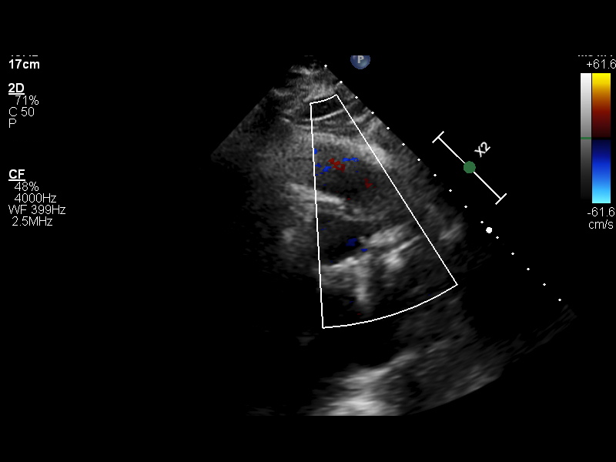
[im 27/102]
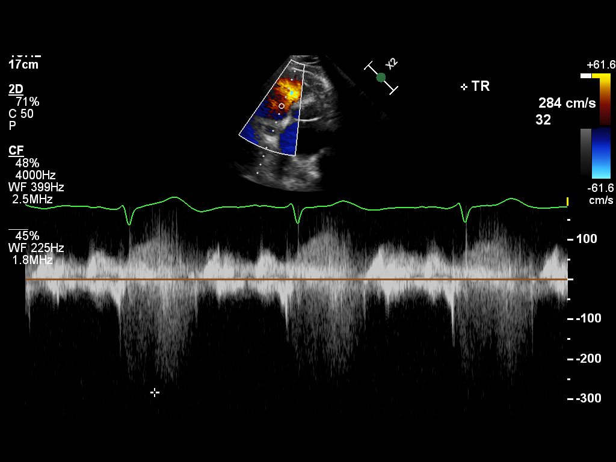
[im 36/102]
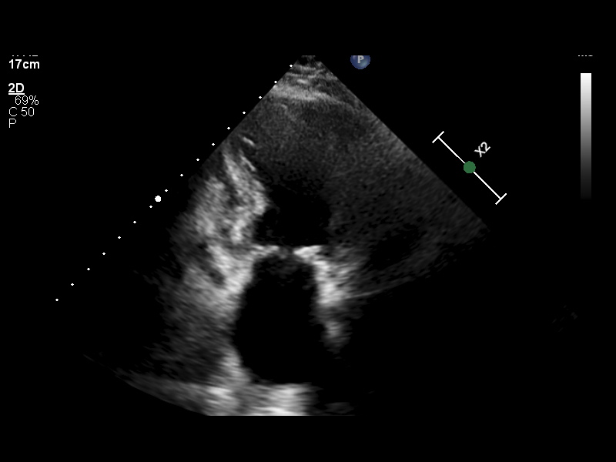
[im 40/102]
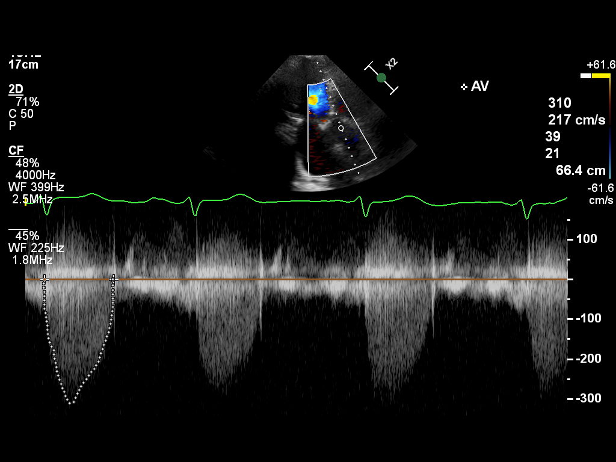
[im 53/102]
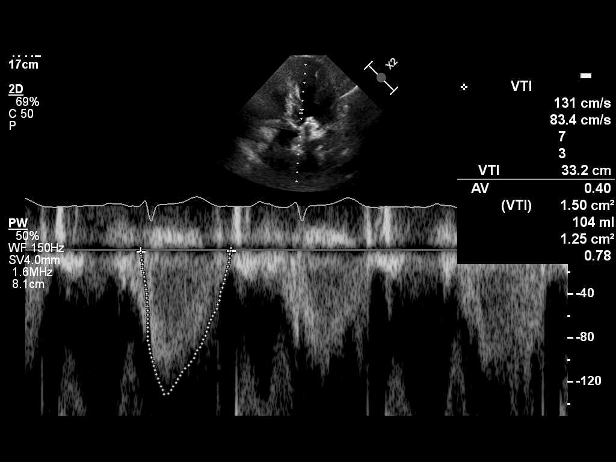
[im 58/102]
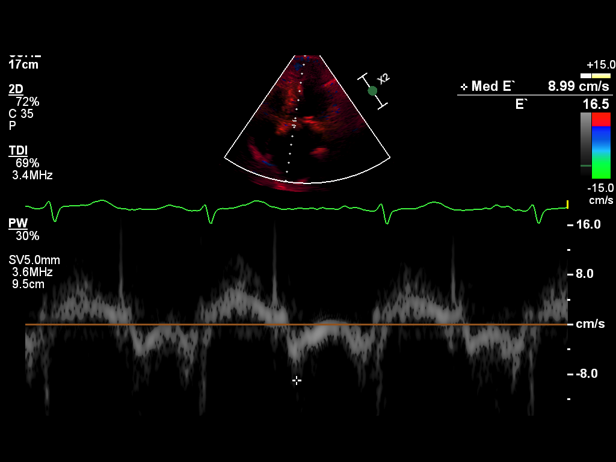
[im 66/102]
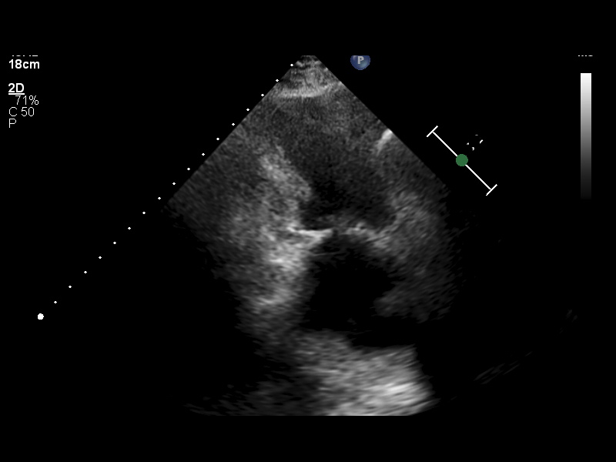
[im 75/102]
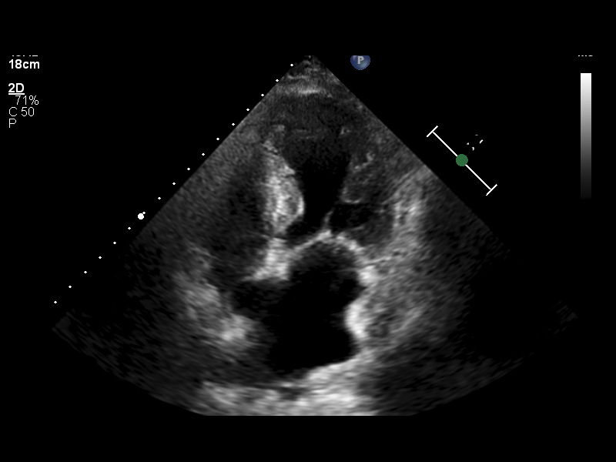
[im 80/102]
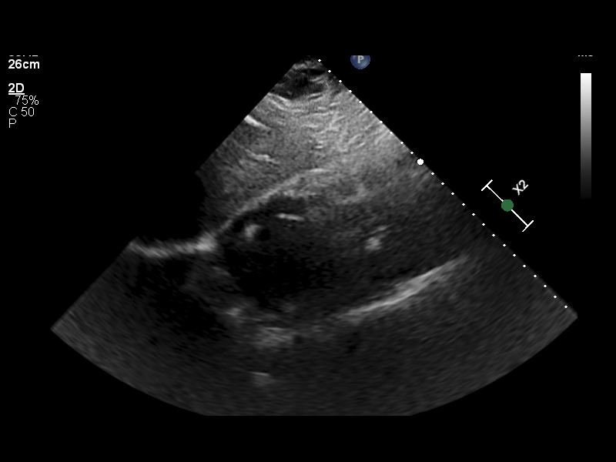
[im 84/102]
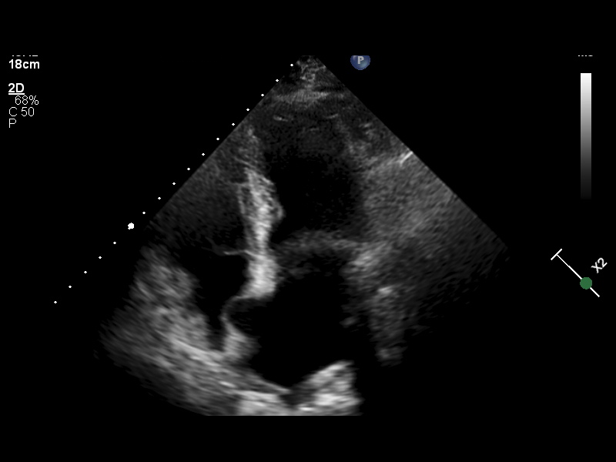
[im 88/102]
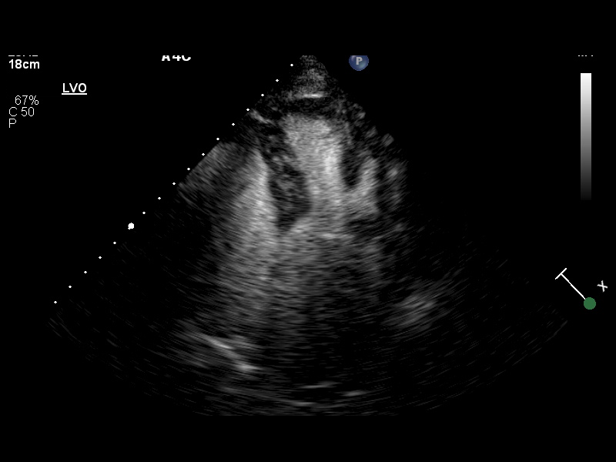
[im 93/102]
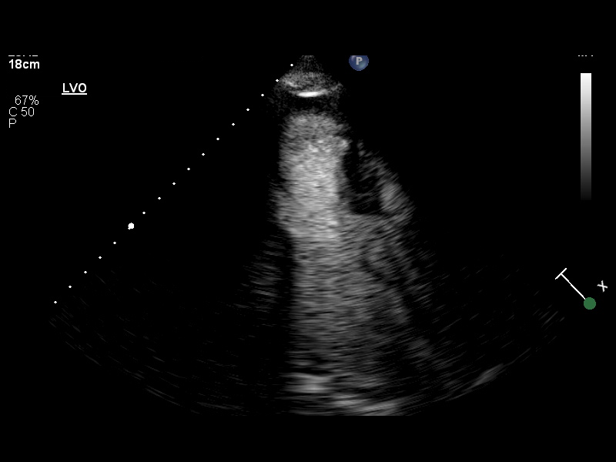
[im 102/102]
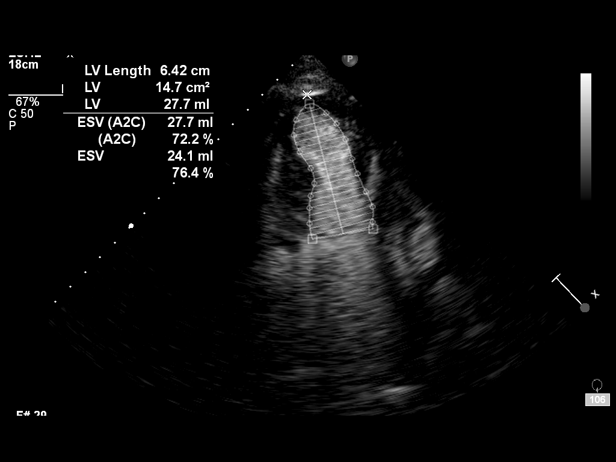

[16 of 24 positions shown; findings below may reference images not displayed]

05/21/20 -  2D + DOPPLER ECHO
Location Performed: [HOSPITAL]

Referring Provider:
Fellow:
Location of Interp:
Sonographer: External Staff
Machine:  [facility] [employer]

Indications: Stroke

Definity contrast was given to enhance imaging.
Saline contrast was given to evaluate for intracardiac shunt. Technically difficult study with poor
endocardial visualization.
Limited study due to patient's ability to tolerate test.

Vitals
Height   Weight   BSA (Calculated)   BP   Comments
162.6 cm (5' 4")   87.5 kg (193 lb)   1.99   158/84

Interpretation Summary
Normal left ventricular systolic function with an EF of 65%.
No regional wall motion abnormalities.
Unable to assess diastolic function.
The RV was normal in size with overall preserved function.  In some of the short axis views there
did appear to be a D-shaped septum, suggestive of right-sided volume and pressure overload.
There is mitral annular calcification with nonspecific mitral valve thickening.  The posterior
mitral valve leaflet has decreased mobility.  The mean mitral valve gradient was 5.5 mmHg with a
heart rate 85, this does suggest mild mitral valve stenosis.  There is mild MR.
The TR was difficult to appreciate, and some views the jet appeared more prominent, possibly mild to
moderate TR.
The aortic valve is calcific with restricted cusp mobility.  Overall there is probably moderate
aortic valve stenosis, the peak velocity was 3.3 m/s, the peak gradient was 43 mmHg, the mean
gradient was 23 mmHg, the aortic valve velocity ratio 0.39.  The stroke-volume index was within
normal limits at 47 mL/m².
Severe pulmonary hypertension with any peak PAP of 76 mmHg.
Normal aortic root size.  The ascending aorta was mildly dilated.
No pericardial effusion.  There is an epicardial fat pad noted.

No prior studies available for comparison.

Echocardiographic Findings
Left Ventricle   Ventricle not well seen. The left ventricular size is normal. Concentric
hypertrophy. The left ventricular systolic function is normal. The visually estimated ejection
fraction is 65%. The ejection fraction by Simpson's biplane method is 70%. There are no segmental
wall motion abnormalities. Unable to assess left ventricular diastolic function.
Right Ventricle   Ventricle not well seen. The right ventricular size, wall thickness and systolic
function are normal. There does appear to be a D-shaped septum suggestive of right-sided volume
pressure overload.
TAPSE was 2.0 cm.
RV free wall S' was 23 cm/s.
Left Atrium   Severely dilated. No color Doppler evidence of PFO.
Bubble study did not demonstrate right to left shunting.  No evidence of PFO with bubble study.
Right Atrium   Normal size. Prominent eustachian valve.
IVC/SVC   Markedly elevated central venous pressure (10-20 mm Hg).
Mitral Valve   Non-specific thickening. The posterior mitral valve leaflet is heavily calcified, it
has decreased mobility. There is probably a mild degree of mitral valve stenosis, the mean mitral
valve gradient was 5.5 mmHg with a heart rate of 85. Mild regurgitation. There is mild mitral
annular calcification without stenosis.
Tricuspid Valve   Normal valve structure. No stenosis. Mild to moderate regurgitation.
Aortic Valve   The valve is calcified. Trileaflet. Moderate stenosis. The peak velocity was 3.3 m/s,
the peak gradient was 43 mmHg, the mean gradient was 23 mmHg, the aortic valve velocity ratio is
0.39.  The stroke-volume index was within normal limits at 47 mL/m². The peak aortic velocity was
measured in the apical view. No regurgitation.
Pulmonary   The pulmonic valve was not seen well but no Doppler evidence of stenosis. Trace
regurgitation.
Normal pulmonary artery.
Aorta   The aortic root is normal in size. The ascending aorta is mildly dilated.
Pericardium   Pericardial fat pad present. No pericardial effusion.

Left Ventricular Wall Scoring
Resting   Score Index: 1.000   Percent Normal: 100.0%

The left ventricular wall motion is normal.

Left Heart 2D Measurements (Normal Ranges)
EF (Visual)
65 %
EF (Simpson's)
70 %
LVIDD
4.3 cm (Range: 3.8 - 5.2)
LVIDS
3.0 cm (Range: 2.2 - 3.5)
IVS
1.6 cm (Range: 0.6 - 0.9)
LV PW
1.4 cm (Range: 0.6 - 0.9)
LA Size
3.8 cm (Range: 2.7 - 3.8)

Right Heart 2D   M-Mode Measurements (Normal Ranges) (Range)
RV Basal Dia
4.1 cm (2.5 - 4.1)
RV Mid Dia
2.6 cm (1.9 - 3.5)
TOTTIO
16.0 cm2 (<18)
M-Mode TAPSE
2 cm (>1.7)

Left Heart 2D Addnl Measurements (Normal Ranges)
LV Systolic Vol
23 mL (Range: 14 - 42)
LV Systolic Vol Index
12 mL (Range: 8 - 24)
LV Diastolic Vol
76 mL (Range: 46 - 106)
LV Diastolic Vol Index
38 mL (Range: 29 - 61)
LA Vol
93 mL (Range: 22 - 52)
LA Vol Index
46.73 (Range: 16 - 34)
LV Mass
258 g (Range: 67 - 162)
LV Mass Index
130 g/m2 (Range: 43 - 95)
RWT
0.65 (Range: <=0.42)

Aortic Root Measurements (Normal Ranges)
Sinus
3.0 cm (Range: 2.4 - 3.6)
AO Prox
3.6 cm (Range: 1.9 - 3.5)

Doppler (Spectral and Color Flow)
Estimated Peak Systolic PA Pressure
Aortic valve area
1.35 cm2
Aortic valve mean gradient
Aortic valve peak gradient
Aortic valve peak velocity
3.3 m/s
Aortic valve velocity ratio

Tech Notes:

definity given lot#[id number] tm

## 2020-05-21 IMAGING — CT CT head/brain wo con
3 of 4 series · 14 of 47 positions shown, 16 images · non-contrast
Comparison: none

[Series 4: brain cor 5.00 hr40 s3 · coronal · 0.31mm/px · 3 of 37 slices shown]
[im 13/37  brain]
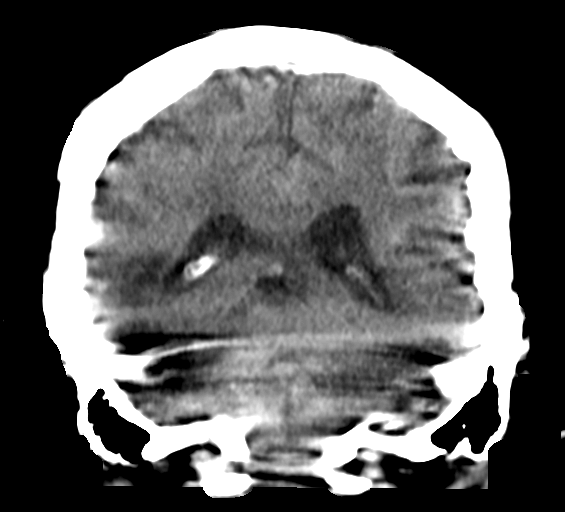
[im 17/37  brain]
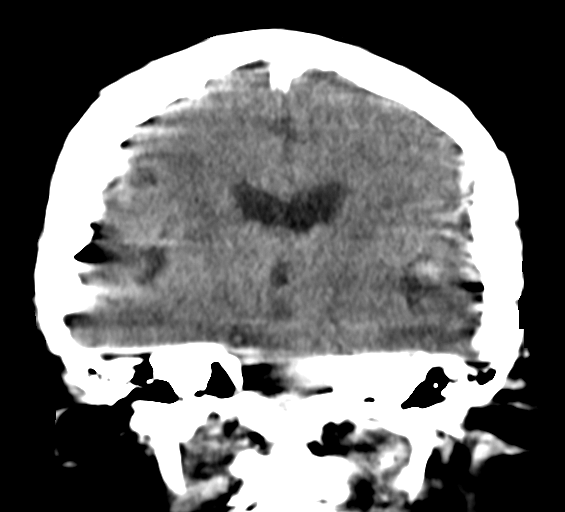
[im 21/37  brain]
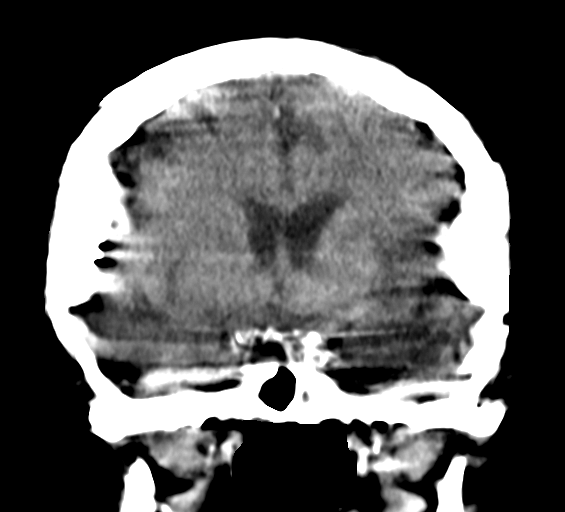

[Series 6: brain sag 5.00 hr40 s3 · sagittal · 0.31mm/px · 3 of 35 slices shown]
[im 12/35  brain]
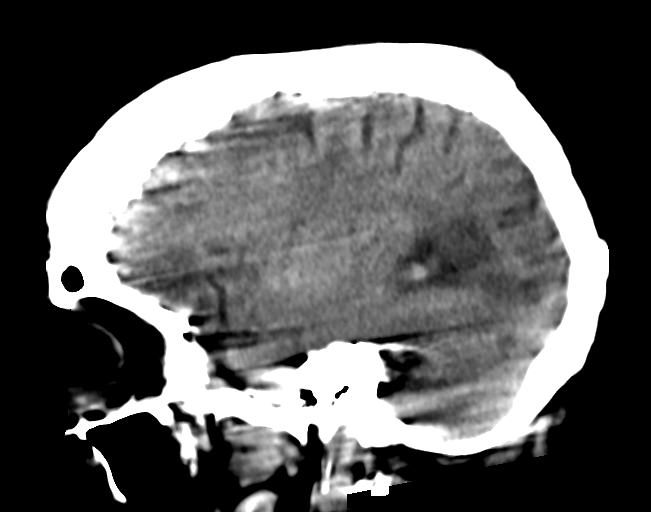
[im 18/35  brain]
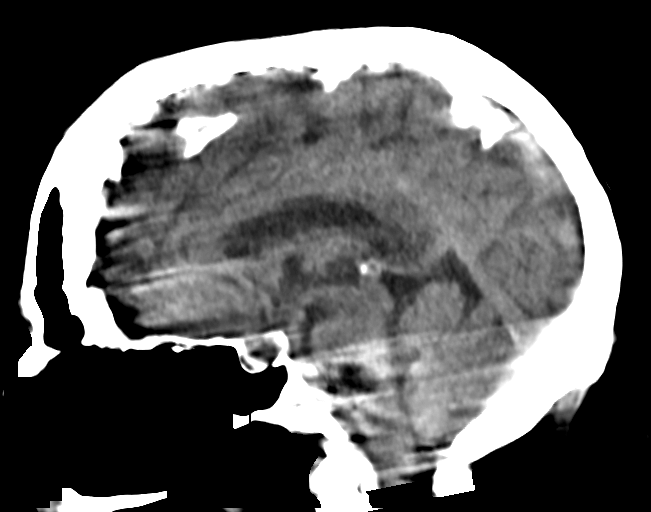
[im 23/35  brain]
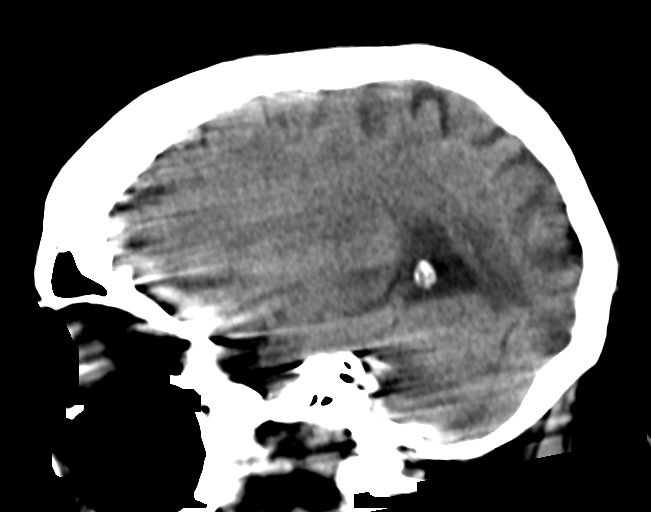

[Series 8: brain ax 2.00 hr60 s3 · axial · 0.34mm/px · z∈[-497,-374]mm · 8 of 77 slices shown, 10 images]
[im 8/77  brain]
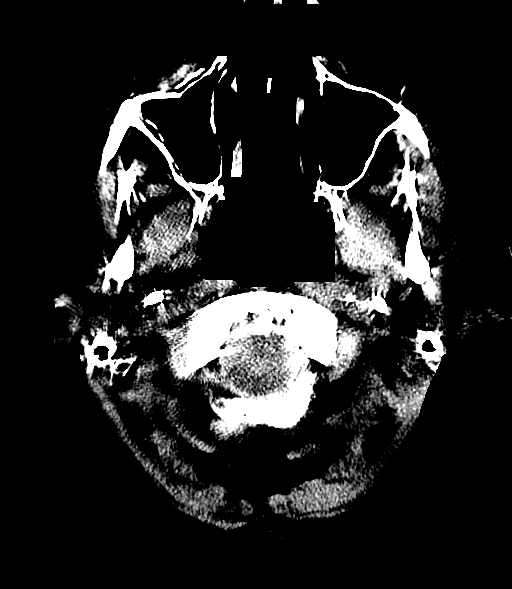
[im 8/77  bone]
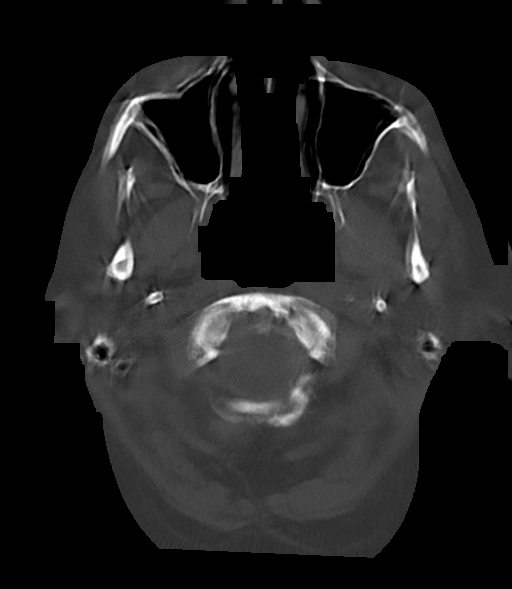
[im 15/77  brain]
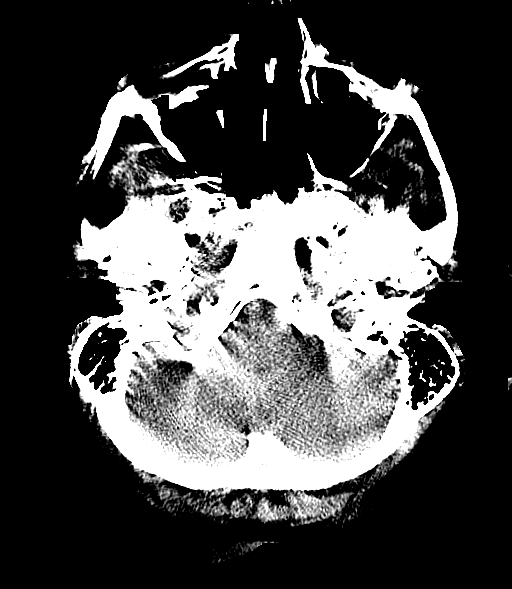
[im 26/77  brain]
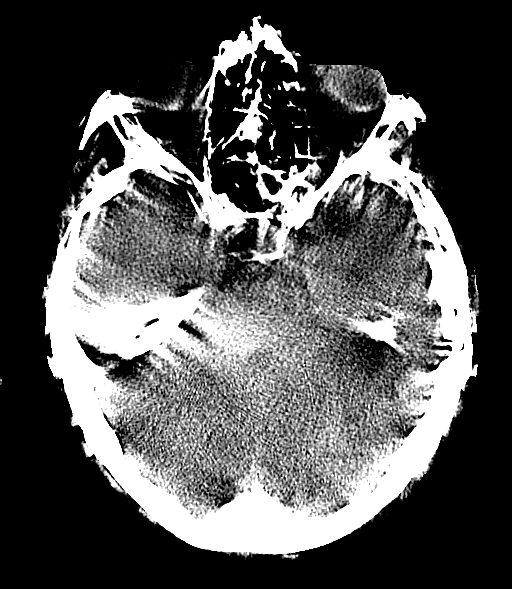
[im 33/77  brain]
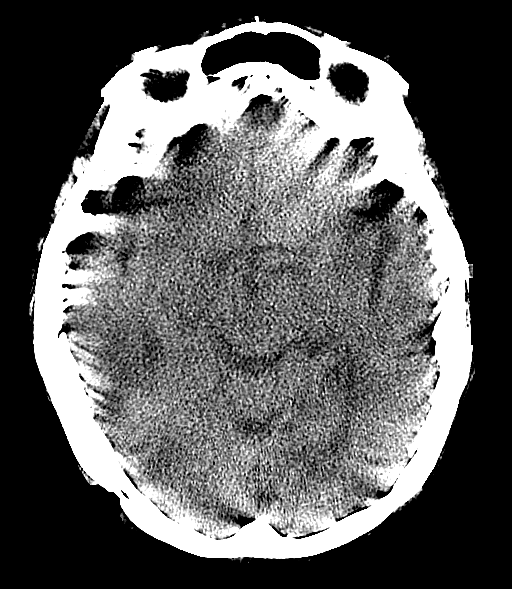
[im 44/77  brain]
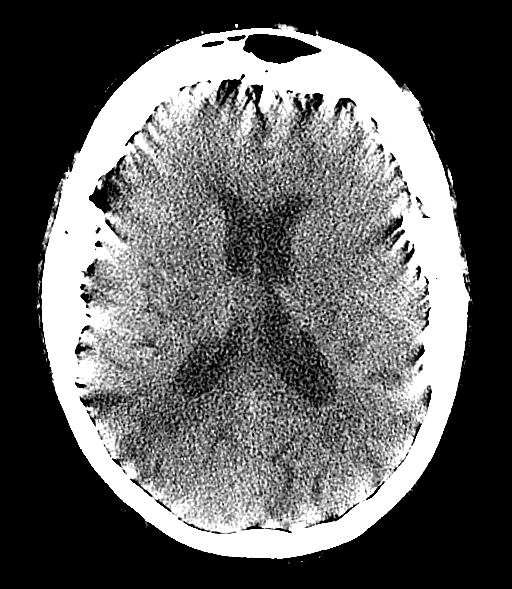
[im 44/77  bone]
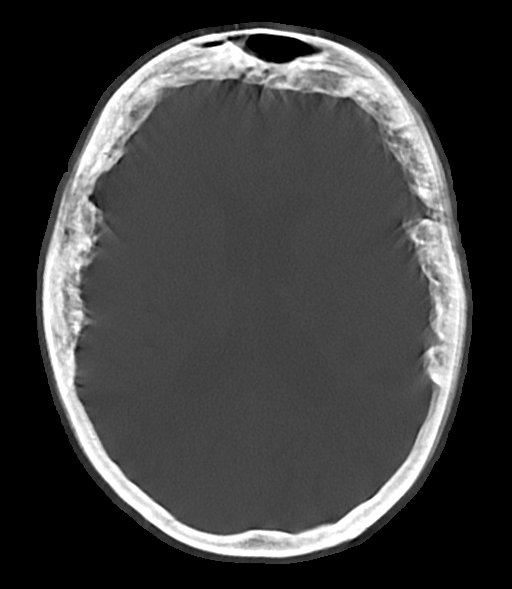
[im 51/77  brain]
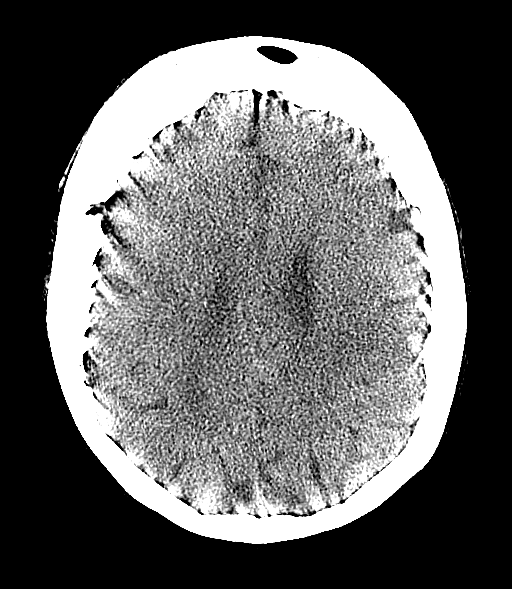
[im 62/77  brain]
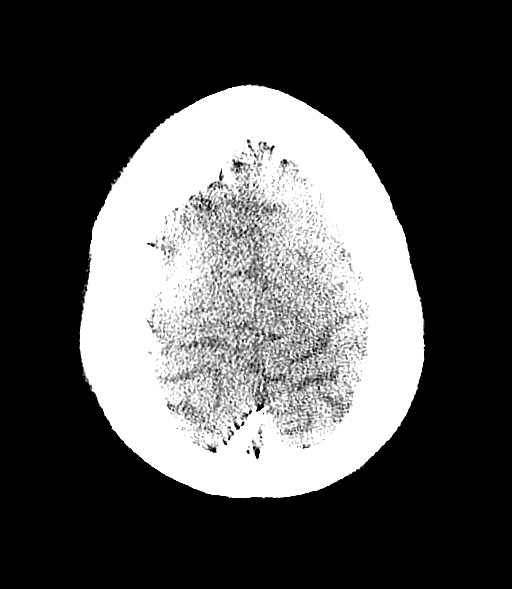
[im 69/77  brain]
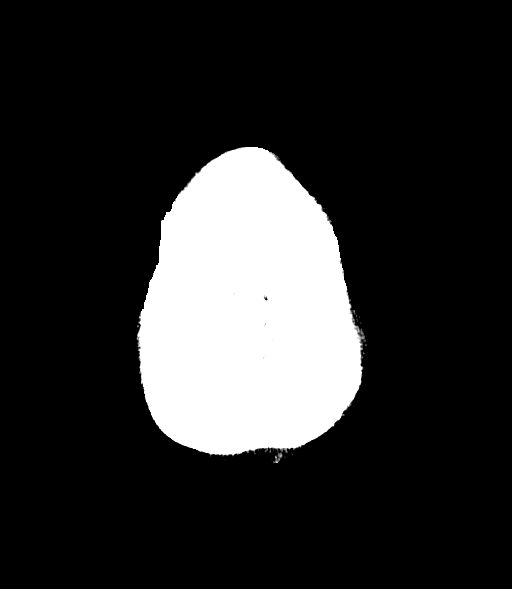

[14 of 47 positions shown; findings below may reference images not displayed]

EXAM

CT HEAD

INDICATION

Stroke protocol

TECHNIQUE

All CT scans at this facility use dose modulation, iterative reconstruction, and/or weight based
dosing when appropriate to reduce radiation dose to as low as reasonably achievable.

CT of the head without contrast was performed.

# of CT scans in the past year: 2 # of Myocardial perfusion scans this past year: 0

COMPARISONS

MR brain 03/16/2020

FINDINGS

Significant limitations secondary to patient motion and streak artifact.

Parenchyma: No acute hemorrhage.  There is no mass effect, midline shift, or herniation. Large area
of hypoattenuation suspected within the right posterior temporal lobe to right occipital lobe
measuring up to 3 cm (series 2, image 17).

Ventricles / Extra-axial spaces: There is no hydrocephalus.  There are no extra-axial fluid
collections.

Other: The bony structures are intact.  Visualized portions of the paranasal sinuses and mastoid
air cells are clear.

IMPRESSION
1. Evolving infarction within the right posterior temporal lobe.
2. Suspected unchanged area of hypoattenuation within the left occipital lobe compatible with prior
insult/infarct.
3. Otherwise, the exam is significantly limited secondary to significant patient motion.
4. No large intracranial hemorrhage.

Tech Notes:

2 CT/0 NM. Headache, dizziness and uncontrollable shaking x this morning. Left sided weakness. H/O
stroke. ME

## 2020-05-23 IMAGING — CR [ID]
1 series · 1 of 1 positions shown · non-contrast
Comparison: none

[chest ap]
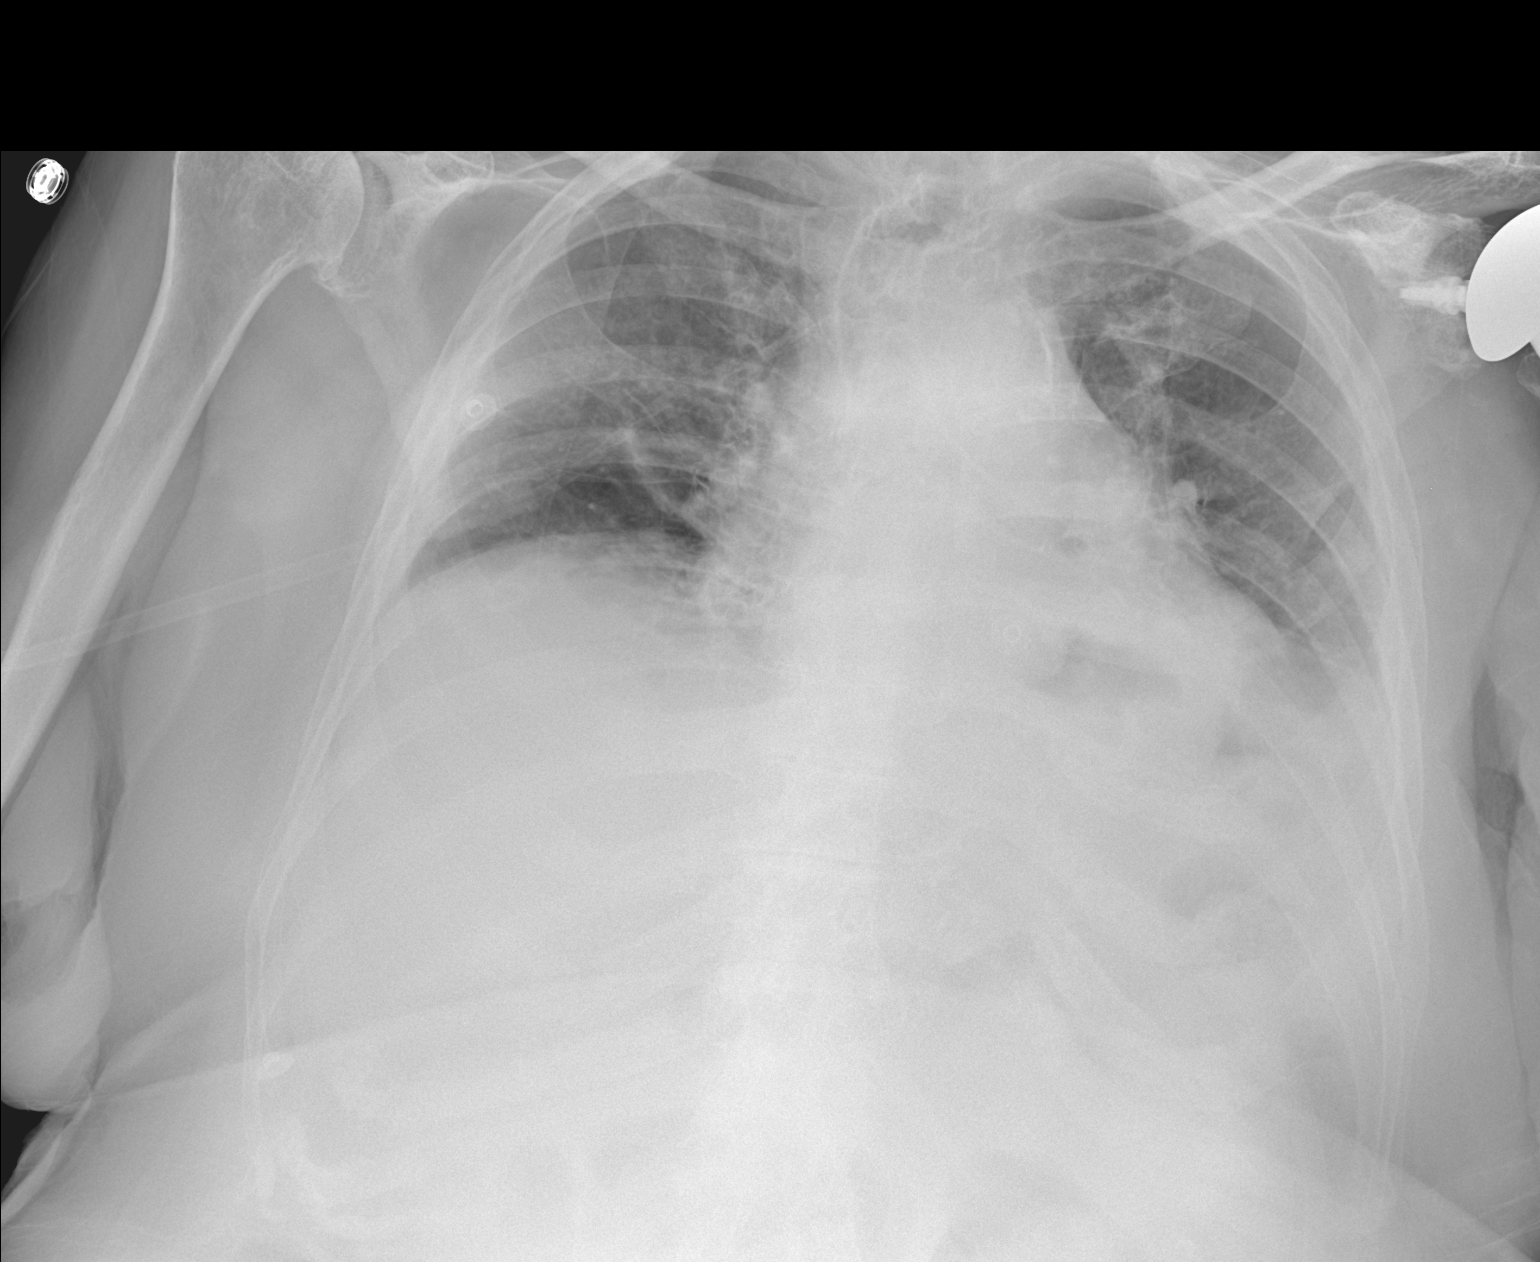

[1 of 1 positions shown; findings below may reference images not displayed]

DIAGNOSTIC STUDIES

EXAM

XR chest 1V

INDICATION

hypoxia
HYPOXIA, EVOLVING CVA

TECHNIQUE

A single frontal image of the chest was obtained.

COMPARISONS

Chest x-ray, March 15, 2020.

FINDINGS

Low lung volumes with patchy bibasal and retrocardiac opacities as before, presumably atelectatic
change. No pneumothorax or significant pleural effusion. Stable cardiomediastinal silhouette. Aortic
arch calcification indicates atherosclerosis. Nonspecific, nonobstructive upper abdominal bowel gas
pattern. Bones are demineralized. AC joint and glenohumeral joint degenerative change. Prior left
total shoulder arthroplasty. Significant degenerative change throughout the spine.

IMPRESSION

Low lung volumes with patchy bibasal/retrocardiac opacities as before, presumably atelectatic
change.

Tech Notes:

HYPOXIA, EVOLVING CVA

## 2020-05-24 IMAGING — MR Head^Brain
1 series · 48 of 48 positions shown · non-contrast
Comparison: none

[Series 4: cow · axial · 0.8mm · 0.56mm/px · z∈[-100,-30]mm · 48 of 85 slices shown]
[im 1/85]
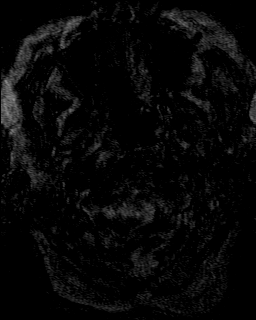
[im 2/85]
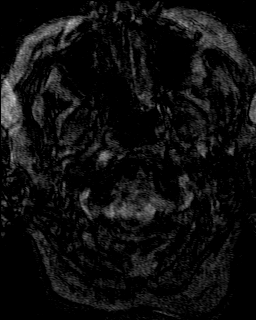
[im 4/85]
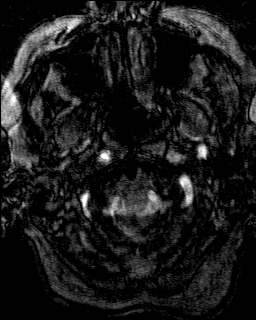
[im 6/85]
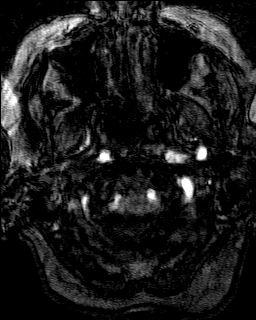
[im 8/85]
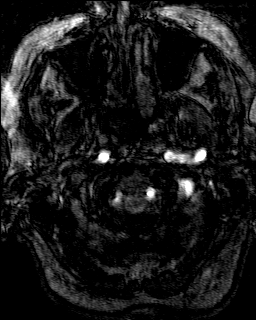
[im 9/85]
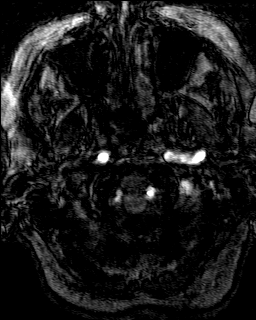
[im 11/85]
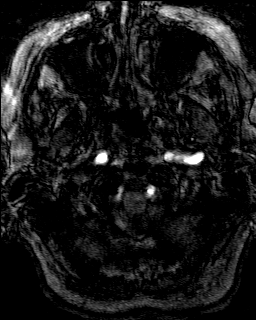
[im 13/85]
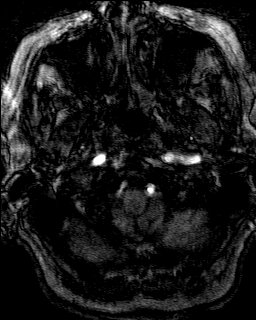
[im 15/85]
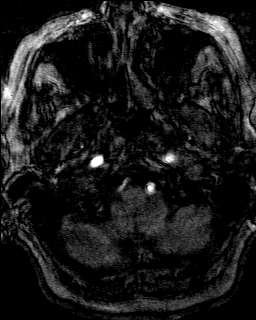
[im 17/85]
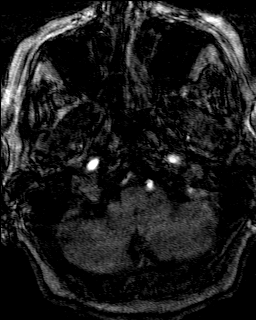
[im 18/85]
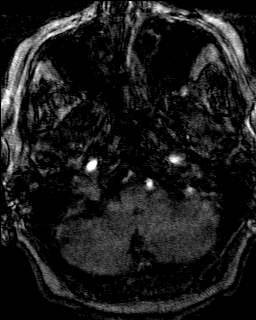
[im 20/85]
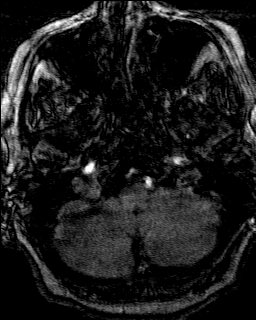
[im 22/85]
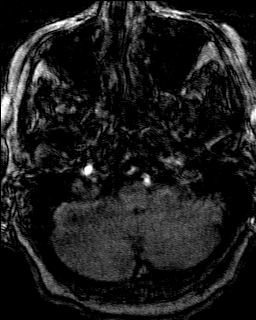
[im 24/85]
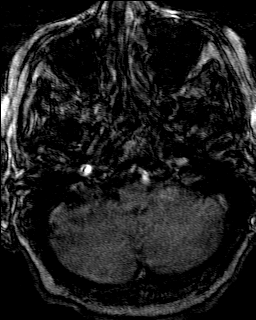
[im 26/85]
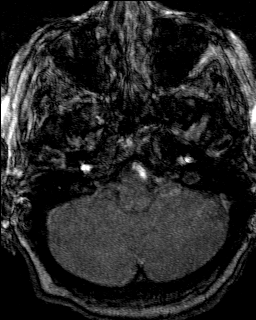
[im 27/85]
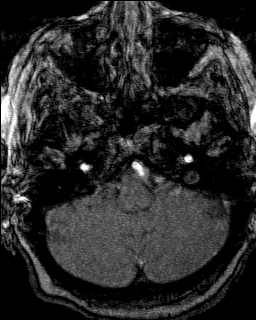
[im 29/85]
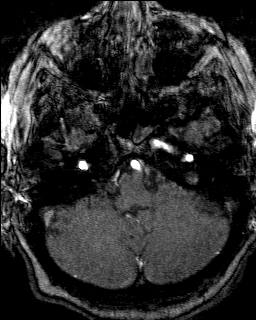
[im 31/85]
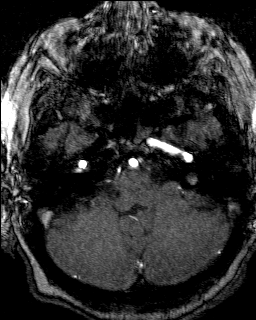
[im 33/85]
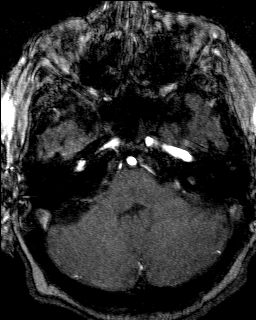
[im 34/85]
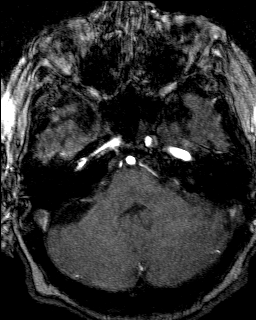
[im 36/85]
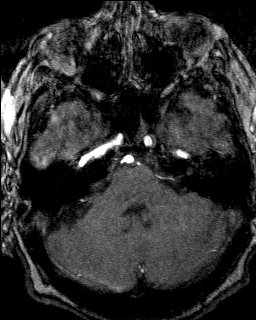
[im 38/85]
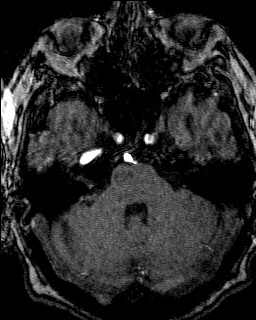
[im 40/85]
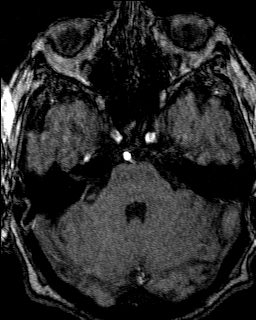
[im 42/85]
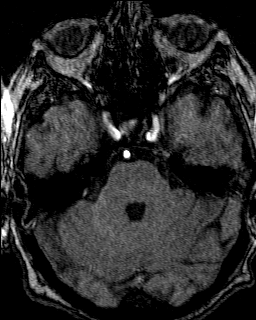
[im 43/85]
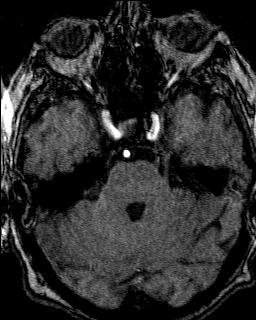
[im 45/85]
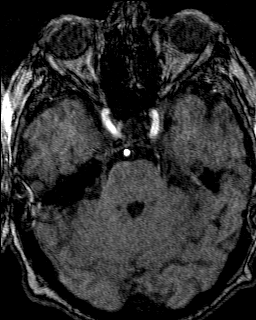
[im 47/85]
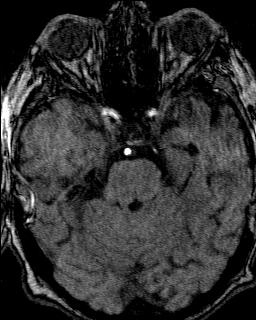
[im 49/85]
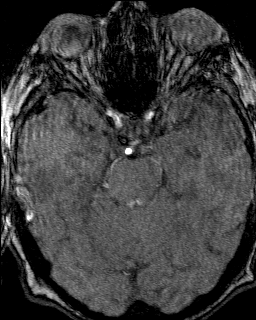
[im 51/85]
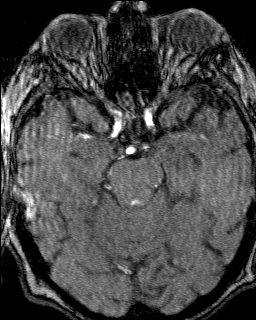
[im 52/85]
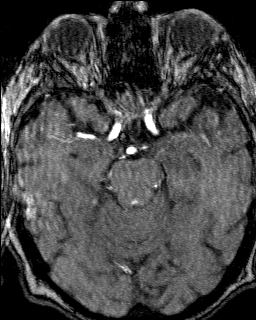
[im 54/85]
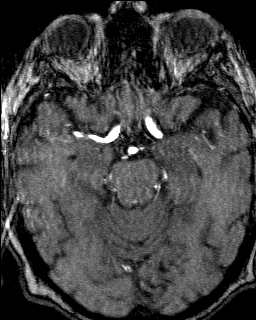
[im 56/85]
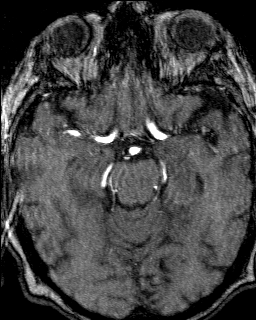
[im 58/85]
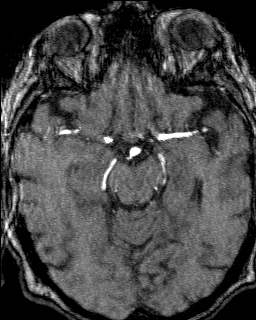
[im 59/85]
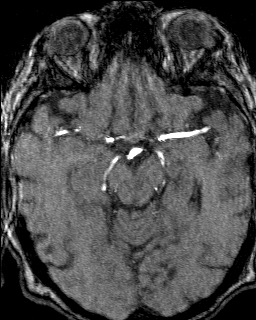
[im 61/85]
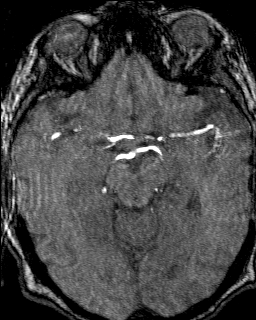
[im 63/85]
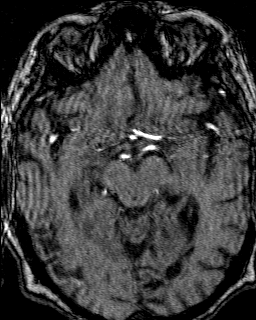
[im 65/85]
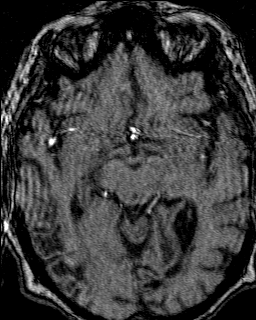
[im 67/85]
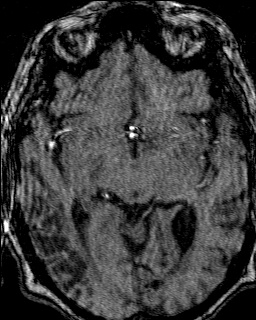
[im 68/85]
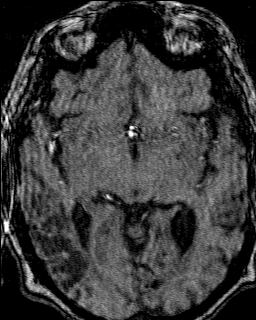
[im 70/85]
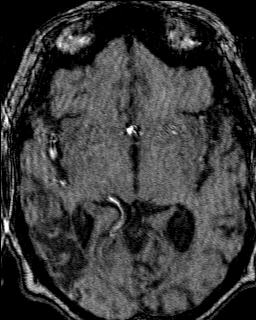
[im 72/85]
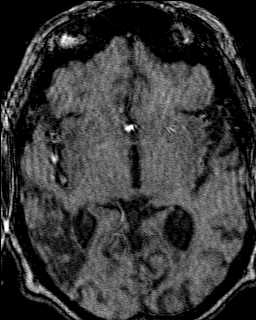
[im 74/85]
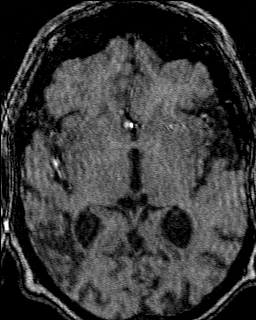
[im 76/85]
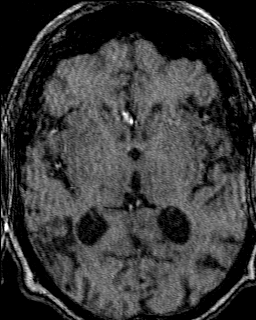
[im 77/85]
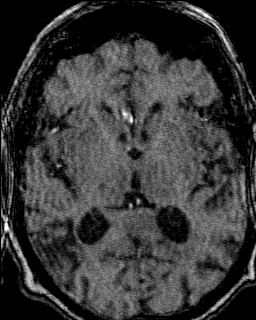
[im 79/85]
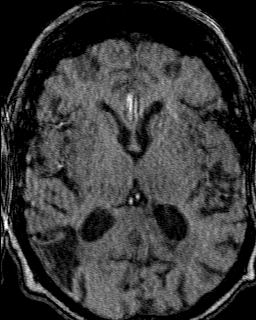
[im 81/85]
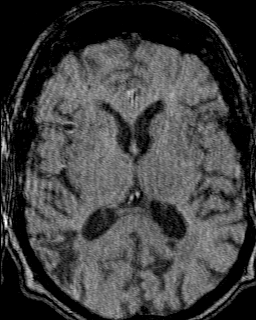
[im 83/85]
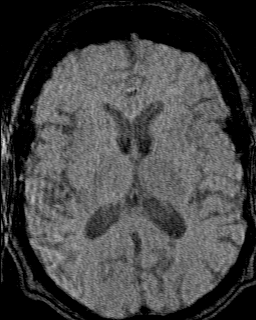
[im 85/85]
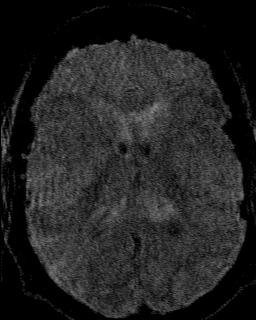

[48 of 48 positions shown; findings below may reference images not displayed]

DIAGNOSTIC STUDIES

EXAM

MAGNETIC RESONANCE ANGIOGRAPHY, HEAD, WITHOUT CONTRAST MATERIAL, CPT 23577

INDICATION

Abnormal blood pressure.

TECHNIQUE

3-D time of flight MRA of the Circle of Willis was performed without contrast. MIP reconstruction
was performed.

COMPARISONS

03/16/2020

FINDINGS

Mild motion artifact. The vertebral, basilar and internal carotid arteries are patent. No definite
aneurysm or occlusion within the circle of Willis.

IMPRESSION

Motion artifact. No definite aneurysm or occlusion.

Tech Notes:

CVA, PT PRESENTED TO ER FRIDAY WITH WORSENING CVA SYMPTOMS, MINIMALLY RESPONSIVE, WEAKNESS, 15 ML
[facility]

## 2020-05-24 IMAGING — MR Head^Brain
9 series · 48 of 48 positions shown · non-contrast
Comparison: none

[Series 2: T1 · sagittal · 5.0mm · 0.45mm/px · 4 of 19 slices shown (1 of 2)]
[im 1/19]
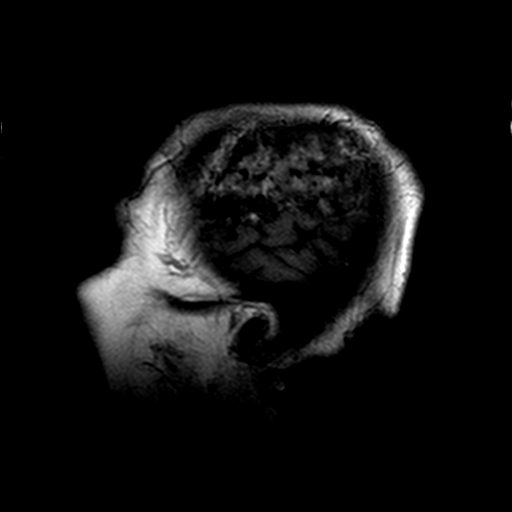
[im 7/19]
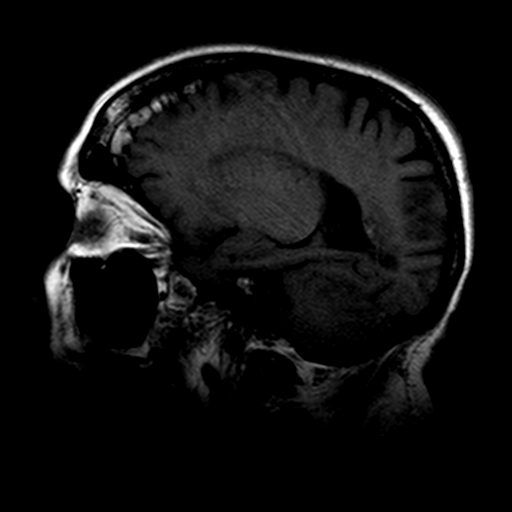
[im 13/19]
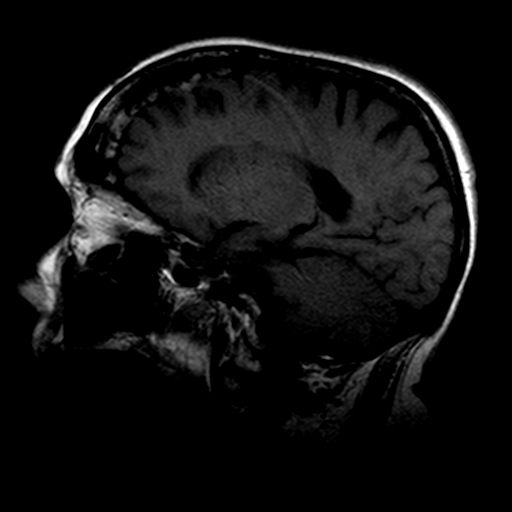
[im 19/19]
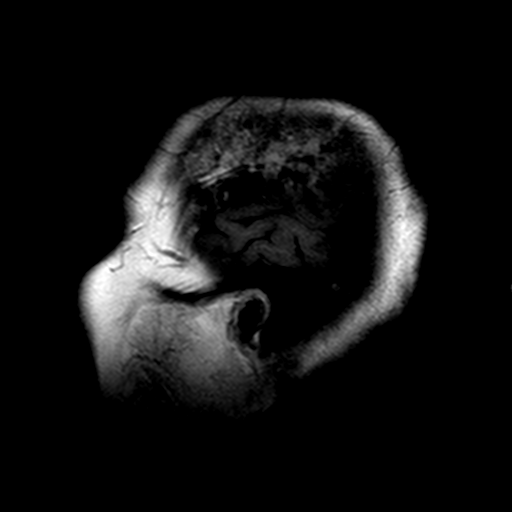

[Series 3: DWI · axial · 5.0mm · 1.80mm/px · z∈[-85,+41]mm · 12 of 52 slices shown (1 of 2)]
[im 1/52]
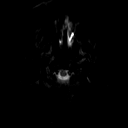
[im 5/52]
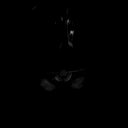
[im 10/52]
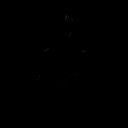
[im 14/52]
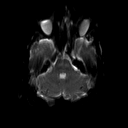
[im 19/52]
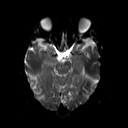
[im 24/52]
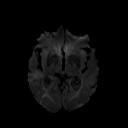
[im 28/52]
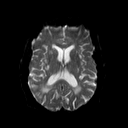
[im 33/52]
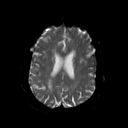
[im 38/52]
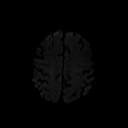
[im 42/52]
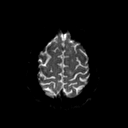
[im 47/52]
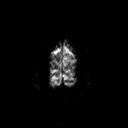
[im 52/52]
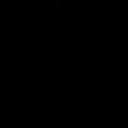

[Series 4: DWI · axial · 5.0mm · 1.80mm/px · z∈[-85,+41]mm · 5 of 20 slices shown (2 of 2)]
[im 1/20]
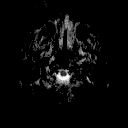
[im 5/20]
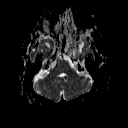
[im 10/20]
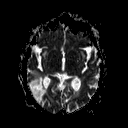
[im 15/20]
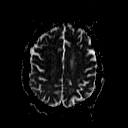
[im 20/20]
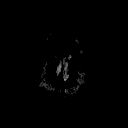

[Series 14: axial blood · axial · 5.0mm · 0.45mm/px · z∈[-99,+34]mm · 5 of 20 slices shown]
[im 1/20]
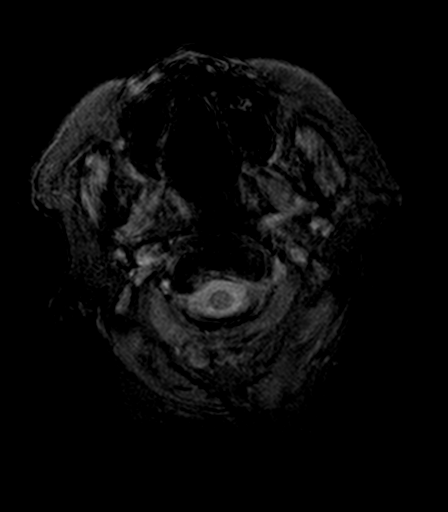
[im 5/20]
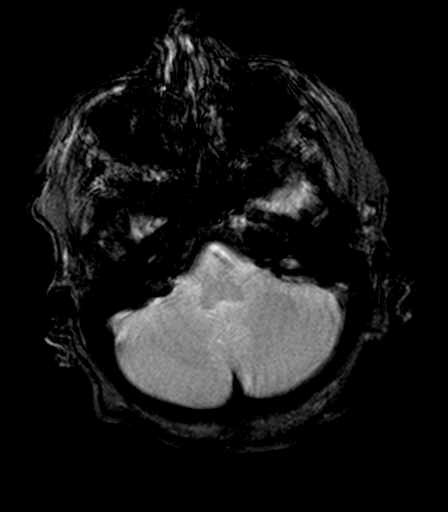
[im 10/20]
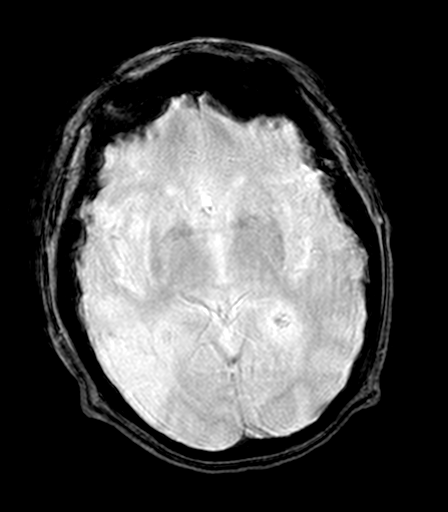
[im 15/20]
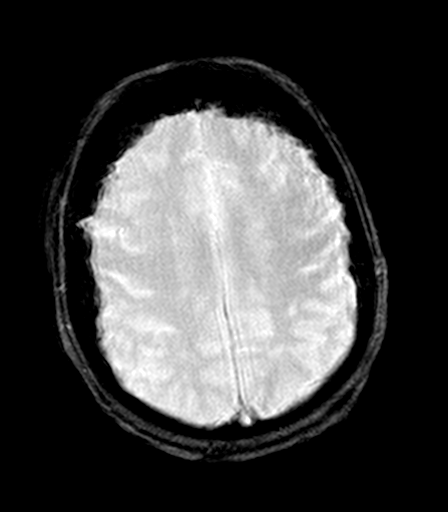
[im 20/20]
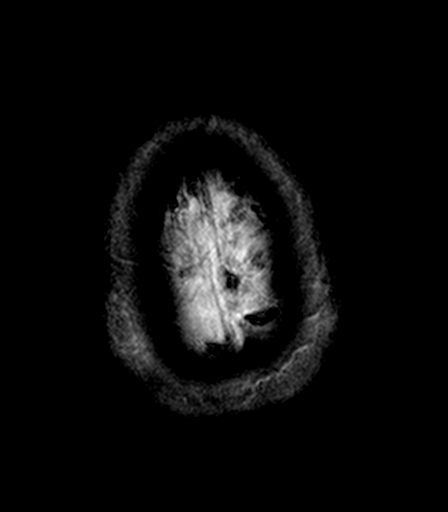

[Series 16: T2 · axial · 5.0mm · 0.72mm/px · z∈[-100,+39]mm · 3 of 12 slices shown]
[im 1/12]
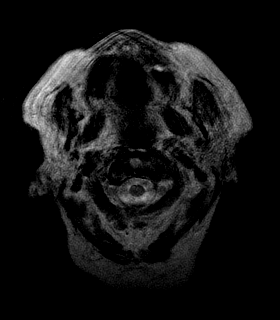
[im 6/12]
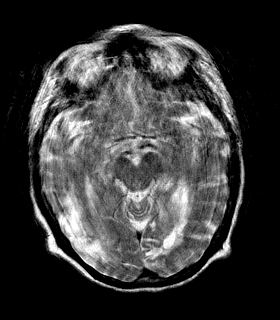
[im 12/12]
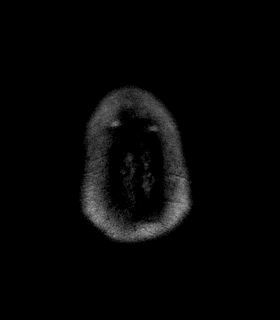

[Series 17: T1 · axial · 5.0mm · 0.45mm/px · z∈[-94,+39]mm · 3 of 14 slices shown (2 of 2)]
[im 1/14]
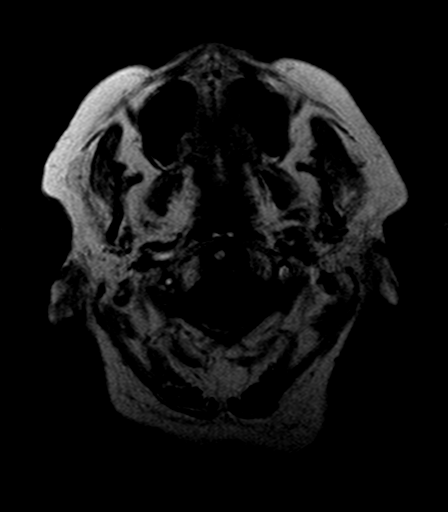
[im 7/14]
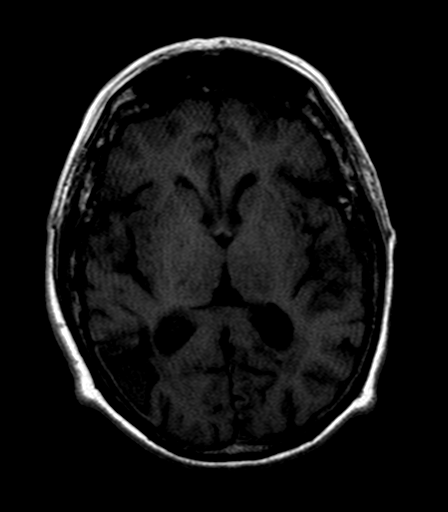
[im 14/14]
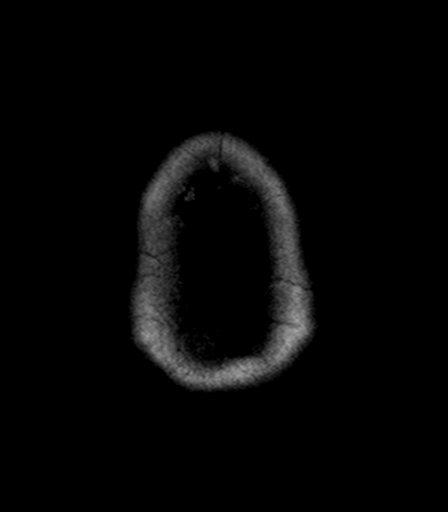

[Series 18: T1 fat-sat post-contrast · axial · 5.0mm · 0.90mm/px · z∈[-99,+47]mm · 6 of 24 slices shown (1 of 3)]
[im 1/24]
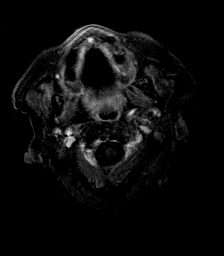
[im 5/24]
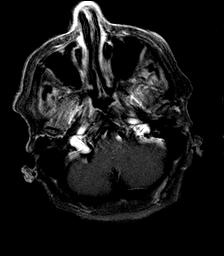
[im 10/24]
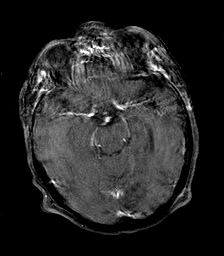
[im 14/24]
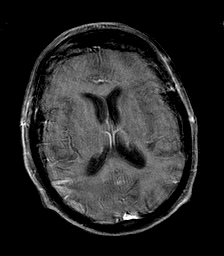
[im 19/24]
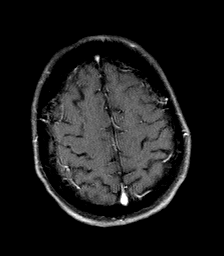
[im 24/24]
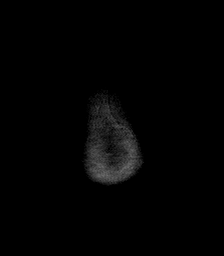

[Series 21: T1 fat-sat post-contrast · axial · 5.0mm · 0.90mm/px · z∈[-97,+48]mm · 5 of 19 slices shown (2 of 3)]
[im 1/19]
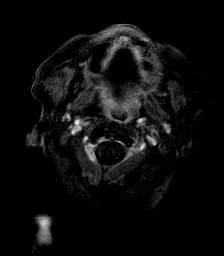
[im 5/19]
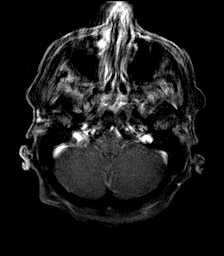
[im 10/19]
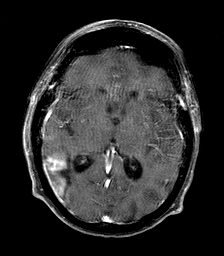
[im 14/19]
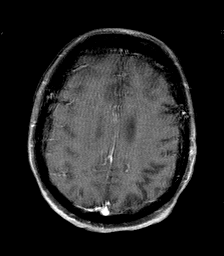
[im 19/19]
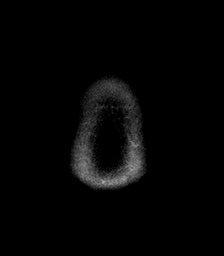

[Series 24: T1 fat-sat post-contrast · coronal · 5.0mm · 0.90mm/px · 5 of 20 slices shown (3 of 3)]
[im 1/20]
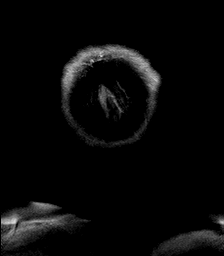
[im 5/20]
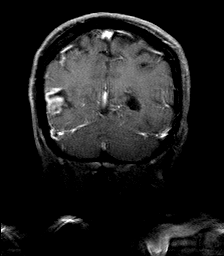
[im 10/20]
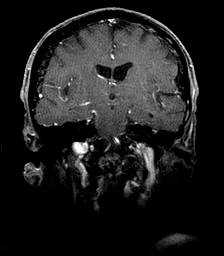
[im 15/20]
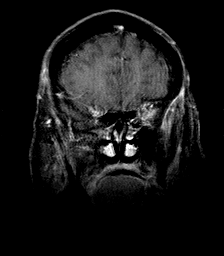
[im 20/20]
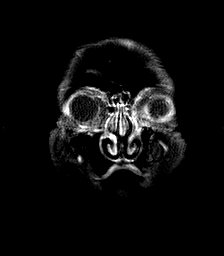

[48 of 48 positions shown; findings below may reference images not displayed]

DIAGNOSTIC STUDIES

EXAM

MAGNETIC RESONANCE IMAGING, BRAIN (INCLUDING BRAIN STEM) WITHOUT CONTRAST MATERIAL, FOLLOWED BY
CONTRAST MATERIAL(S) AND FURTHER SEQUENCES, CPT 93449

INDICATION

Weakness. CVA symptoms.

TECHNIQUE

Multiplanar, multipulse sequence images of the head with and without contrast.

COMPARISONS

CT 05/21/2020. MRI 03/16/2020.

FINDINGS

Limited secondary to motion artifact.

Moderate atrophy. Empty sella. Otherwise, the midline structures appear intact. No tonsillar
ectopia. Flow voids within the vertebral, basilar and internal carotid arteries. Evolving
encephalomalacia within the right parietal lobe. There is a focus of restricted diffusion within the
right temporal lobe. No gross hemorrhage or hydrocephalus. There is significant motion artifact on
the axial T1 postcontrast images. There is enhancement within the right parietal infarcted region.
No intra or extra-axial fluid collections. The visualized orbits appear grossly unremarkable. The
paranasal sinuses appear well aerated. The mastoid air cells appear well aerated.

IMPRESSION

Limited secondary to motion.

1. Acute lacunar infarct within the right temporal lobe.

2. Subacute right parietal lobe infarct.

Follow-up with a Neurology consultation can be performed.

The report was called to the patient's nurse on 05/24/2020 at [DATE] a.m..

Tech Notes:

CVA, PT PRESENTED TO ER FRIDAY WITH WORSENING CVA SYMPTOMS, MINIMALLY RESPONSIVE, WEAKNESS, 15 ML
[facility] RG

## 2020-07-09 DEATH — deceased
